# Patient Record
Sex: Female | Born: 1990 | Race: White | Hispanic: No | Marital: Single | Smoking: Never smoker
Health system: Southern US, Community
[De-identification: ages and names within clinical notes are randomized; demographics above are authoritative.]

## PROBLEM LIST (undated history)

## (undated) DIAGNOSIS — F410 Panic disorder [episodic paroxysmal anxiety] without agoraphobia: Secondary | ICD-10-CM

## (undated) DIAGNOSIS — E669 Obesity, unspecified: Secondary | ICD-10-CM

## (undated) DIAGNOSIS — H532 Diplopia: Secondary | ICD-10-CM

## (undated) DIAGNOSIS — E538 Deficiency of other specified B group vitamins: Secondary | ICD-10-CM

## (undated) DIAGNOSIS — N390 Urinary tract infection, site not specified: Secondary | ICD-10-CM

## (undated) DIAGNOSIS — R109 Unspecified abdominal pain: Secondary | ICD-10-CM

## (undated) DIAGNOSIS — F909 Attention-deficit hyperactivity disorder, unspecified type: Secondary | ICD-10-CM

## (undated) DIAGNOSIS — G43909 Migraine, unspecified, not intractable, without status migrainosus: Secondary | ICD-10-CM

## (undated) DIAGNOSIS — R002 Palpitations: Secondary | ICD-10-CM

## (undated) DIAGNOSIS — E559 Vitamin D deficiency, unspecified: Secondary | ICD-10-CM

## (undated) DIAGNOSIS — M549 Dorsalgia, unspecified: Secondary | ICD-10-CM

## (undated) DIAGNOSIS — R519 Headache, unspecified: Secondary | ICD-10-CM

## (undated) DIAGNOSIS — R599 Enlarged lymph nodes, unspecified: Secondary | ICD-10-CM

## (undated) DIAGNOSIS — L900 Lichen sclerosus et atrophicus: Secondary | ICD-10-CM

## (undated) DIAGNOSIS — R6889 Other general symptoms and signs: Secondary | ICD-10-CM

## (undated) DIAGNOSIS — R079 Chest pain, unspecified: Secondary | ICD-10-CM

## (undated) DIAGNOSIS — E271 Primary adrenocortical insufficiency: Secondary | ICD-10-CM

## (undated) DIAGNOSIS — K635 Polyp of colon: Secondary | ICD-10-CM

## (undated) DIAGNOSIS — N904 Leukoplakia of vulva: Secondary | ICD-10-CM

## (undated) DIAGNOSIS — R51 Headache: Secondary | ICD-10-CM

## (undated) DIAGNOSIS — E785 Hyperlipidemia, unspecified: Secondary | ICD-10-CM

## (undated) DIAGNOSIS — L509 Urticaria, unspecified: Secondary | ICD-10-CM

## (undated) DIAGNOSIS — N83201 Unspecified ovarian cyst, right side: Secondary | ICD-10-CM

## (undated) DIAGNOSIS — F419 Anxiety disorder, unspecified: Secondary | ICD-10-CM

## (undated) DIAGNOSIS — F32A Depression, unspecified: Secondary | ICD-10-CM

## (undated) DIAGNOSIS — F329 Major depressive disorder, single episode, unspecified: Secondary | ICD-10-CM

## (undated) DIAGNOSIS — K909 Intestinal malabsorption, unspecified: Secondary | ICD-10-CM

## (undated) DIAGNOSIS — Z91018 Allergy to other foods: Secondary | ICD-10-CM

## (undated) DIAGNOSIS — R12 Heartburn: Secondary | ICD-10-CM

## (undated) DIAGNOSIS — K6389 Other specified diseases of intestine: Secondary | ICD-10-CM

## (undated) DIAGNOSIS — N200 Calculus of kidney: Secondary | ICD-10-CM

## (undated) DIAGNOSIS — R5383 Other fatigue: Secondary | ICD-10-CM

## (undated) DIAGNOSIS — D369 Benign neoplasm, unspecified site: Secondary | ICD-10-CM

## (undated) DIAGNOSIS — L309 Dermatitis, unspecified: Secondary | ICD-10-CM

## (undated) DIAGNOSIS — K638219 Small intestinal bacterial overgrowth, unspecified: Secondary | ICD-10-CM

## (undated) DIAGNOSIS — E282 Polycystic ovarian syndrome: Secondary | ICD-10-CM

## (undated) HISTORY — DX: Urinary tract infection, site not specified: N39.0

## (undated) HISTORY — DX: Intestinal malabsorption, unspecified: K90.9

## (undated) HISTORY — PX: KIDNEY STONE SURGERY: SHX686

## (undated) HISTORY — DX: Enlarged lymph nodes, unspecified: R59.9

## (undated) HISTORY — DX: Anxiety disorder, unspecified: F41.9

## (undated) HISTORY — PX: COLONOSCOPY: SHX174

## (undated) HISTORY — DX: Unspecified ovarian cyst, right side: N83.201

## (undated) HISTORY — DX: Palpitations: R00.2

## (undated) HISTORY — DX: Urticaria, unspecified: L50.9

## (undated) HISTORY — DX: Depression, unspecified: F32.A

## (undated) HISTORY — DX: Leukoplakia of vulva: N90.4

## (undated) HISTORY — DX: Allergy to other foods: Z91.018

## (undated) HISTORY — DX: Major depressive disorder, single episode, unspecified: F32.9

## (undated) HISTORY — DX: Hyperlipidemia, unspecified: E78.5

## (undated) HISTORY — DX: Headache, unspecified: R51.9

## (undated) HISTORY — DX: Deficiency of other specified B group vitamins: E53.8

## (undated) HISTORY — DX: Vitamin D deficiency, unspecified: E55.9

## (undated) HISTORY — DX: Chest pain, unspecified: R07.9

## (undated) HISTORY — DX: Unspecified abdominal pain: R10.9

## (undated) HISTORY — DX: Obesity, unspecified: E66.9

## (undated) HISTORY — DX: Other general symptoms and signs: R68.89

## (undated) HISTORY — DX: Diplopia: H53.2

## (undated) HISTORY — DX: Headache: R51

## (undated) HISTORY — DX: Panic disorder (episodic paroxysmal anxiety): F41.0

## (undated) HISTORY — DX: Dorsalgia, unspecified: M54.9

## (undated) HISTORY — DX: Attention-deficit hyperactivity disorder, unspecified type: F90.9

## (undated) HISTORY — DX: Polyp of colon: K63.5

## (undated) HISTORY — DX: Migraine, unspecified, not intractable, without status migrainosus: G43.909

## (undated) HISTORY — DX: Lichen sclerosus et atrophicus: L90.0

## (undated) HISTORY — DX: Primary adrenocortical insufficiency: E27.1

## (undated) HISTORY — DX: Polycystic ovarian syndrome: E28.2

## (undated) HISTORY — DX: Other specified diseases of intestine: K63.89

## (undated) HISTORY — DX: Heartburn: R12

## (undated) HISTORY — DX: Calculus of kidney: N20.0

## (undated) HISTORY — DX: Benign neoplasm, unspecified site: D36.9

## (undated) HISTORY — DX: Dermatitis, unspecified: L30.9

## (undated) HISTORY — DX: Other fatigue: R53.83

## (undated) HISTORY — DX: Small intestinal bacterial overgrowth, unspecified: K63.8219

## (undated) HISTORY — PX: OTHER SURGICAL HISTORY: SHX169

---
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
## 2011-08-17 HISTORY — PX: KIDNEY STONE SURGERY: SHX686

## 2011-08-17 HISTORY — PX: CHOLECYSTECTOMY: SHX55

## 2014-05-08 LAB — HM COLONOSCOPY

## 2014-07-10 ENCOUNTER — Encounter: Payer: Self-pay | Admitting: Physician Assistant

## 2015-02-07 ENCOUNTER — Ambulatory Visit (INDEPENDENT_AMBULATORY_CARE_PROVIDER_SITE_OTHER): Payer: PRIVATE HEALTH INSURANCE | Admitting: Family

## 2015-02-07 ENCOUNTER — Encounter (INDEPENDENT_AMBULATORY_CARE_PROVIDER_SITE_OTHER): Payer: Self-pay

## 2015-02-07 VITALS — BP 140/94 | HR 89 | Temp 98.1°F | Resp 16

## 2015-02-07 DIAGNOSIS — R5383 Other fatigue: Secondary | ICD-10-CM

## 2015-02-07 DIAGNOSIS — R1030 Lower abdominal pain, unspecified: Secondary | ICD-10-CM

## 2015-02-07 MED ORDER — DICYCLOMINE HCL 10 MG PO CAPS
ORAL_CAPSULE | ORAL | Status: DC
Start: 2015-02-07 — End: 2015-07-04

## 2015-02-07 NOTE — Progress Notes (Signed)
Subjective:      Date: 02/07/2015 7:31 PM   Patient ID: Audrey Franklin is a 24 y.o. female.    Chief Complaint:  Chief Complaint   Patient presents with   . 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 She says the past 3 weeks she's had more diarrhea.  Pt also wants to get tested for B12 levels. She used to take B12 but since the change with [facility], she hasn't gotten her shots and hasn't seen a new PCP since [employer] left. Pt has an appt with Dr. Marcille Blanco 02/19/15. Pt states she has been experiencing a lot of fatigue.    Abdominal Pain  This is a new problem. Episode onset: 3 weeks ago. The onset quality is gradual. The problem occurs constantly. The problem has been gradually worsening. The pain is located in the RUQ. Associated symptoms include diarrhea, headaches and nausea. Pertinent negatives include no dysuria, fever, flatus, frequency or vomiting. Associated symptoms comments: More frequent diarrhea; Felt feverish; Fatigue.       Problem List:  There is no problem list on file for this patient.      Current Medications:  Current Outpatient Prescriptions   Medication Sig Dispense Refill   . atomoxetine (STRATTERA) 60 MG capsule Take 60 mg by mouth daily.     Marland Kitchen buPROPion XL (WELLBUTRIN XL) 150 MG 24 hr tablet Take 150 mg by mouth every morning.     . clobetasol (TEMOVATE) 0.05 % cream Apply 1 application topically 2 (two) times daily.     Marland Kitchen EPINEPHrine (EPIPEN) 0.3 MG/0.3ML Solution Auto-injector Inject 0.3 mg into the muscle once.     . fluticasone (FLONASE) 50 MCG/ACT nasal spray 1 spray by Nasal route daily.     . norethindrone-ethinyl estradiol (JUNEL FE 1/20) 1-20 MG-MCG per tablet Take 1 tablet by mouth daily.     . Nutritional Supplements (ADRENAL COMPLEX PO) Take by mouth.     . Vilazodone HCl 20 MG Tab Take 1 tablet by mouth daily.     . vitamin B-12 (CYANOCOBALAMIN) 50 MCG tablet Take 500 mcg by  mouth daily.       No current facility-administered medications for this visit.       Allergies:  Allergies   Allergen Reactions   . Ceclor [Cefaclor] Hives       Past Medical History:  History reviewed. No pertinent past medical history.    Past Surgical History:  History reviewed. No pertinent past surgical history.    Family History:  History reviewed. No pertinent family history.    Social History:  History     Social History   . Marital Status: Single     Spouse Name: N/A   . Number of Children: N/A   . Years of Education: N/A     Occupational History   . Not on file.     Social History Main Topics   . Smoking status: Not on file   . Smokeless tobacco: Not on file   . Alcohol Use: Not on file   . Drug Use: Not on file   . Sexual Activity: Not on file     Other Topics Concern   . Not on file     Social History Narrative   . No narrative on file       The following portions of the patient's history  were reviewed and updated as appropriate: allergies, current medications, past family history, past medical history, past social history, past surgical history and problem list.    Vitals:  Filed Vitals:    02/07/15 1940   BP: 140/94   Pulse: 89   Temp: 98.1 F (36.7 C)   Resp: 16   SpO2: 98%     ROS:  Review of Systems   Constitutional: Negative for fever and chills.   Respiratory: Negative for cough, shortness of breath and wheezing.    Cardiovascular: Negative for chest pain, palpitations and orthopnea.   Gastrointestinal: Positive for nausea, abdominal pain and diarrhea. Negative for vomiting and flatus.   Genitourinary: Negative for dysuria, urgency and frequency.   Skin: Negative for itching and rash.   Neurological: Positive for headaches.     Objective:     Physical Exam:  Physical Exam   Constitutional: She is oriented to person, place, and time and well-developed, well-nourished, and in no distress.   HENT:   Head: Normocephalic and atraumatic.   Cardiovascular: Normal rate, regular rhythm, normal heart  sounds and intact distal pulses.    Pulmonary/Chest: Effort normal and breath sounds normal.   Abdominal: Soft. Normal appearance and bowel sounds are normal. There is tenderness in the right lower quadrant and left lower quadrant.   Neurological: She is alert and oriented to person, place, and time.   Skin: Skin is warm and dry.     Assessment/Plan:       1. Fatigue, unspecified type    2. Lower abdominal pain  - Vitamin B12  - CBC and differential  - Comprehensive metabolic panel  - Magnesium  - Amylase  - Lipase  - dicyclomine (BENTYL) 10 MG capsule; 1 tab po q 6 prn abd cramping  Dispense: 30 capsule; Refill: 0    Take Bentyl as prescribed to help with the cramping.  If cramping worsens due to SIBO, please stop taking the medication immediately.  Return tomorrow after you hydrate as we attempted 3 times today without success.    Pt agreeable to plan.    Luz Brazen, DNP, FNP-BC    Supervising Physician: Dr. Sophronia Simas

## 2015-02-19 ENCOUNTER — Encounter (INDEPENDENT_AMBULATORY_CARE_PROVIDER_SITE_OTHER): Payer: Self-pay | Admitting: Internal Medicine

## 2015-02-19 ENCOUNTER — Ambulatory Visit (INDEPENDENT_AMBULATORY_CARE_PROVIDER_SITE_OTHER): Payer: PRIVATE HEALTH INSURANCE | Admitting: Family Medicine

## 2015-02-19 ENCOUNTER — Ambulatory Visit (INDEPENDENT_AMBULATORY_CARE_PROVIDER_SITE_OTHER): Payer: PRIVATE HEALTH INSURANCE | Admitting: Internal Medicine

## 2015-02-19 ENCOUNTER — Encounter (INDEPENDENT_AMBULATORY_CARE_PROVIDER_SITE_OTHER): Payer: Self-pay | Admitting: Family Medicine

## 2015-02-19 ENCOUNTER — Other Ambulatory Visit (INDEPENDENT_AMBULATORY_CARE_PROVIDER_SITE_OTHER): Payer: Self-pay | Admitting: Internal Medicine

## 2015-02-19 VITALS — BP 160/100 | HR 99 | Temp 98.0°F | Resp 16 | Ht 63.0 in | Wt 246.0 lb

## 2015-02-19 VITALS — BP 123/85 | HR 85 | Temp 98.4°F | Resp 16 | Ht 63.0 in | Wt 244.8 lb

## 2015-02-19 DIAGNOSIS — E282 Polycystic ovarian syndrome: Secondary | ICD-10-CM | POA: Insufficient documentation

## 2015-02-19 DIAGNOSIS — R5383 Other fatigue: Secondary | ICD-10-CM

## 2015-02-19 DIAGNOSIS — I10 Essential (primary) hypertension: Secondary | ICD-10-CM

## 2015-02-19 DIAGNOSIS — Z6841 Body Mass Index (BMI) 40.0 and over, adult: Secondary | ICD-10-CM

## 2015-02-19 NOTE — Progress Notes (Signed)
Subjective:      Date: 02/19/2015 10:32 AM   Patient ID: Audrey Franklin is a 24 y.o. female.    Chief Complaint:  Chief Complaint   Patient presents with   . Follow-up     PCOS       HPI     Patient is here for PCOS . LMP: 02/17/2014 . Patient complains of this last month June, having every other week menstrual periods. Patient complains of continued fatigue , diarrhea has gradually gotten worse. Patient is going to go to [facility] in September for SIBO.    Has had multiple episodes of diarrhea. Seen by Dr. Marcille Blanco this morning. Has an appointment with [facility] for chronic SIBO. Has been treated with multiple rounds of antibiotics and has not noticed any symptoms relief.     Taking junel and reports regular menses on the birth control. Had an unexpected menses after missing a few doses of birth control. Reports interval worsening of acne. No interval worsening of hirsutism and alopecia.    Seen by GI- Dr. Allena Katz for chronic diarrhea. Weight gain of over 30 lbs within the last 1 year. PGM has h/o thyroid dysfunction. Ms. Chawla is a known lesbian and reports good family support. H/o heavy ad irregular menses, improved on birth control.     Problem List:  Patient Active Problem List   Diagnosis   . PCOS (polycystic ovarian syndrome)   . Morbid obesity with BMI of 40.0-44.9, adult       Current Medications:  Current Outpatient Prescriptions   Medication Sig Dispense Refill   . atomoxetine (STRATTERA) 60 MG capsule Take 60 mg by mouth daily.     Marland Kitchen buPROPion XL (WELLBUTRIN XL) 150 MG 24 hr tablet Take 150 mg by mouth every morning.     . clobetasol (TEMOVATE) 0.05 % cream Apply 1 application topically 2 (two) times daily.     Marland Kitchen dicyclomine (BENTYL) 10 MG capsule 1 tab po q 6 prn abd cramping 30 capsule 0   . EPINEPHrine (EPIPEN) 0.3 MG/0.3ML Solution Auto-injector Inject 0.3 mg into the muscle once.     . fluticasone (FLONASE) 50 MCG/ACT nasal spray 1 spray by Nasal route daily.     . norethindrone-ethinyl estradiol (JUNEL FE 1/20)  1-20 MG-MCG per tablet Take 1 tablet by mouth daily.     . Nutritional Supplements (ADRENAL COMPLEX PO) Take by mouth.     . Vilazodone HCl 20 MG Tab Take 1 tablet by mouth daily.     . cyanocobalamin (VITAMIN B12) 1000 MCG/ML injection USE 1 ML ONCE WEEKLY  3   . vitamin B-12 (CYANOCOBALAMIN) 50 MCG tablet Take 500 mcg by mouth daily.       No current facility-administered medications for this visit.       Allergies:  Allergies   Allergen Reactions   . Bee Pollen    . Ceclor [Cefaclor] Hives       Past Medical History:  Past Medical History   Diagnosis Date   . Heartburn    . Anxiety    . ADHD (attention deficit hyperactivity disorder)    . Eczema    . Tubular adenoma    . Small intestinal bacterial overgrowth    . Fatigue    . PCOS (polycystic ovarian syndrome)    . Kidney stones        Past Surgical History:  Past Surgical History   Procedure Laterality Date   . Kidney stone surgery     .  Wisdome teeth         Family History:  Family History   Problem Relation Age of Onset   . Diabetes Mother    . Hypertension Mother    . Cancer Paternal Aunt    . Stroke Paternal Grandfather        Social History:  History     Social History   . Marital Status: Single     Spouse Name: N/A   . Number of Children: N/A   . Years of Education: N/A     Occupational History   . Not on file.     Social History Main Topics   . Smoking status: Never Smoker    . Smokeless tobacco: Never Used   . Alcohol Use: No   . Drug Use: No   . Sexual Activity: No     Other Topics Concern   . Not on file     Social History Narrative       The following portions of the patient's history were reviewed and updated as appropriate: allergies, current medications, past family history, past medical history, past social history, past surgical history and problem list.    ROS: A complete 12 point ROS was obtained. Pertinent positives and negatives as noted in HPI. All other systems are negative.    Vitals:  BP 123/85 mmHg  Pulse 85  Temp(Src) 98.4 F (36.9  C)  Resp 16  Ht 1.6 m (5\' 3" )  Wt 111.041 kg (244 lb 12.8 oz)  BMI 43.38 kg/m2  SpO2 98%  LMP 02/18/2015       Physical Exam   Constitutional: She is oriented to person, place, and time. She appears well-developed and well-nourished.   HENT:   Head: Normocephalic and atraumatic.   Mouth/Throat: Oropharynx is clear and moist.   Eyes: Conjunctivae and EOM are normal. Pupils are equal, round, and reactive to light.   Neck: Normal range of motion. Neck supple. No thyromegaly present.   Cardiovascular: Normal rate, regular rhythm, normal heart sounds and intact distal pulses.    Pulmonary/Chest: Effort normal and breath sounds normal.   Abdominal: Soft. Bowel sounds are normal. She exhibits no distension. There is no tenderness.   Musculoskeletal: Normal range of motion. She exhibits no edema or tenderness.   No tremors on outstretched arms.   Lymphadenopathy:     She has no cervical adenopathy.   Neurological: She is alert and oriented to person, place, and time. She has normal reflexes. No cranial nerve deficit.   Skin: Skin is warm and dry.   Psychiatric: She has a normal mood and affect.     ASSESSMENT AND PLAN    1. PCOS (polycystic ovarian syndrome)  Menses regular on OCPs, interval improvement in acne, hirsutism and alopecia. Not started on metformin due to ongoing episodes of diarrhea.  - Comprehensive metabolic panel  - TSH  - T4, free  - Hemoglobin A1C  - DHEA-sulfate  - Testosterone    2. Morbid obesity with BMI of 40.0-44.9, adult  Recommend calorie restricted diet and exercise for weight loss      3. Body mass index 40.0-44.9, adult    4. Small Intestinal Bacterial Overgrowth  Followed by gastroenterologist; plans to follow up at [facility]    RTC in 3 months    Tanya Nones MD MPH  Endocrinologist, [facility]

## 2015-02-19 NOTE — Patient Instructions (Signed)
Polycystic Ovary Syndrome  Polycystic ovary syndrome (PCOS) causes harmless, smallcysts in the ovaries and other symptoms. PCOS is caused by certain hormones being out of balance. The word "syndrome" means a group of symptoms. Women with PCOS may have no periods, irregular periods, or very long periods.    Your ovaries  Women store their eggs in their ovaries. Each egg is in a capsule called a follicle. Normally during the reproductive years, one follicle grows to produce a mature egg each month. This egg is released during ovulation and the follicle dissolves.  Hormones out of balance  With polycystic ovary syndrome (PCOS), the hormones that control ovulation are out of balance. These include estrogen, progesterones, and androgen.As a result, ovulation may not occur. Instead, thefollicle stays enlarged. This is a fluid-filled sac (cyst). Over time, the ovaries fill with many small cysts. This is why they are called "poly" (many) "cystic" ovaries. In some women, the ovaries also make toomuch androgen.      2000-2015 [employer] [address], [place] [address]. All rights reserved. This information is not intended as a substitute for professional medical care. Always follow your healthcare professional's instructions.

## 2015-02-19 NOTE — Progress Notes (Addendum)
Subjective:      Date: 02/19/2015 9:30 AM   Patient ID: Audrey Franklin is a 24 y.o. female.    Chief Complaint:  Chief Complaint   Patient presents with   . Fatigue       HPI:  Fatigue  This is a chronic problem. Episode onset: on going 1.5 years. The problem occurs constantly. The problem has been gradually worsening. Associated symptoms include abdominal pain, fatigue, headaches and nausea. Treatments tried: b 12.   Patient recently had B12 levels and Lyme's testing which were both normal. She is under the care of Dr. Allena Katz for chronic abdominal pain-she will be undergoing a clinical trial at [facility] in 3 months to help treat SIBO infection non-pharmacologically.    Patient's BP has been elevated during the past 2 visits. She is not checking at home. Trying to maintain normal exercise routine despite issues with joint pain and abdmonial pain. Has never been evaluated for sleep apnea.    Problem List:  There is no problem list on file for this patient.      Current Medications:  Current Outpatient Prescriptions   Medication Sig Dispense Refill   . atomoxetine (STRATTERA) 60 MG capsule Take 60 mg by mouth daily.     Marland Kitchen buPROPion XL (WELLBUTRIN XL) 150 MG 24 hr tablet Take 150 mg by mouth every morning.     . clobetasol (TEMOVATE) 0.05 % cream Apply 1 application topically 2 (two) times daily.     Marland Kitchen dicyclomine (BENTYL) 10 MG capsule 1 tab po q 6 prn abd cramping 30 capsule 0   . EPINEPHrine (EPIPEN) 0.3 MG/0.3ML Solution Auto-injector Inject 0.3 mg into the muscle once.     . fluticasone (FLONASE) 50 MCG/ACT nasal spray 1 spray by Nasal route daily.     . norethindrone-ethinyl estradiol (JUNEL FE 1/20) 1-20 MG-MCG per tablet Take 1 tablet by mouth daily.     . Nutritional Supplements (ADRENAL COMPLEX PO) Take by mouth.     . Vilazodone HCl 20 MG Tab Take 1 tablet by mouth daily.     . vitamin B-12 (CYANOCOBALAMIN) 50 MCG tablet Take 500 mcg by mouth daily.     . cyanocobalamin (VITAMIN B12) 1000 MCG/ML injection  USE 1 ML ONCE WEEKLY  3     No current facility-administered medications for this visit.       Allergies:  Allergies   Allergen Reactions   . Bee Pollen    . Ceclor [Cefaclor] Hives       Past Medical History:  Past Medical History   Diagnosis Date   . Heartburn    . Anxiety    . ADHD (attention deficit hyperactivity disorder)    . Eczema    . Tubular adenoma    . Small intestinal bacterial overgrowth    . Fatigue    . PCOS (polycystic ovarian syndrome)    . Kidney stones        Past Surgical History:  Past Surgical History   Procedure Laterality Date   . Kidney stone surgery     . Wisdome teeth         Family History:  Family History   Problem Relation Age of Onset   . Diabetes Mother    . Hypertension Mother    . Cancer Paternal Aunt    . Stroke Paternal Grandfather        Social History:  History     Social History   . Marital Status: Single  Spouse Name: N/A   . Number of Children: N/A   . Years of Education: N/A     Occupational History   . Not on file.     Social History Main Topics   . Smoking status: Never Smoker    . Smokeless tobacco: Never Used   . Alcohol Use: No   . Drug Use: No   . Sexual Activity: No     Other Topics Concern   . Not on file     Social History Narrative       The following portions of the patient's history were reviewed and updated as appropriate: allergies, current medications, past family history, past medical history, past social history, past surgical history and problem list.      ROS:  General/Constitutional:   Denies Chills. Admits Fatigue. Denies Fever.   Ophthalmologic:   Denies Blurred vision.   ENT:   Denies Nasal Discharge. Denies Sinus pain. Denies Sore throat.   Respiratory:   Denies Cough. Denies Shortness of breath. Denies Wheezing.   Cardiovascular:   Denies Chest pain. Denies Chest pain with exertion. Denies Palpitations.   Gastrointestinal:   Admits Abdominal pain. Denies Constipation. Admits Diarrhea. Denies Nausea.   Skin:   Admits Rash.   Neurologic:   Denies  Dizziness. Denies Headache. Denies Tingling/Numbness.       Objective:     Vitals:  BP 160/100 mmHg  Pulse 99  Temp(Src) 98 F (36.7 C)  Resp 16  Ht 1.6 m (5\' 3" )  Wt 111.585 kg (246 lb)  BMI 43.59 kg/m2  SpO2 99%  LMP 02/18/2015    Physical Exam:  General Examination:   GENERAL APPEARANCE: alert, in no acute distress, well developed, well nourished, oriented to time, place, and person.   ORAL CAVITY:  mucosa moist.   LUNGS: normal effort / no distress  EXTREMITIES: no clubbing, cyanosis, or edema bilaterally.   PERIPHERAL PULSES: 2+ dorsalis pedis, 2+ posterior tibial bilaterally.   NEUROLOGIC: alert and oriented.       Assessment/Plan:       1. Fatigue, unspecified type  Chronic, stable. Discussed possible follow up with Naturopathic provider to review dietary changes/supplementation. Will discuss further at upcoming visit.    2. Essential hypertension  Isolated, stable. Recommend patient start checking BP twice daily, for at least 3/7 days weekly and return with log in 2 weeks. If consistently >140/90, will provide low dose medication. May consider referral to sleep medicine as well.      Carney Bern, MD

## 2015-02-20 LAB — COMPREHENSIVE METABOLIC PANEL
ALT: 24 IU/L (ref 0–32)
AST (SGOT): 22 IU/L (ref 0–40)
Albumin/Globulin Ratio: 1.4 (ref 1.1–2.5)
Albumin: 4.3 g/dL (ref 3.5–5.5)
Alkaline Phosphatase: 89 IU/L (ref 39–117)
BUN / Creatinine Ratio: 14 (ref 8–20)
BUN: 10 mg/dL (ref 6–20)
Bilirubin, Total: 0.4 mg/dL (ref 0.0–1.2)
CO2: 23 mmol/L (ref 18–29)
Calcium: 9.3 mg/dL (ref 8.7–10.2)
Chloride: 99 mmol/L (ref 97–108)
Creatinine: 0.74 mg/dL (ref 0.57–1.00)
EGFR: 115 mL/min/{1.73_m2} (ref >59)
EGFR: 132 mL/min/{1.73_m2} (ref >59)
Globulin, Total: 3.1 g/dL (ref 1.5–4.5)
Glucose: 80 mg/dL (ref 65–99)
Potassium: 4.1 mmol/L (ref 3.5–5.2)
Protein, Total: 7.4 g/dL (ref 6.0–8.5)
Sodium: 139 mmol/L (ref 134–144)

## 2015-02-20 LAB — HEMOGLOBIN A1C: Hemoglobin A1C: 5 % (ref 4.8–5.6)

## 2015-02-20 LAB — T4, FREE: T4, Free: 1.12 ng/dL (ref 0.82–1.77)

## 2015-02-20 LAB — TESTOSTERONE: Testosterone: 16 ng/dL (ref 8–48)

## 2015-02-20 LAB — TSH: TSH: 2.18 u[IU]/mL (ref 0.450–4.500)

## 2015-02-20 LAB — DHEA-SULFATE: DHEA-SO4: 158.5 ug/dL (ref 110.0–431.7)

## 2015-03-01 ENCOUNTER — Ambulatory Visit (INDEPENDENT_AMBULATORY_CARE_PROVIDER_SITE_OTHER): Payer: PRIVATE HEALTH INSURANCE | Admitting: Family Medicine

## 2015-03-01 ENCOUNTER — Encounter (INDEPENDENT_AMBULATORY_CARE_PROVIDER_SITE_OTHER): Payer: Self-pay | Admitting: Family Medicine

## 2015-03-01 VITALS — BP 135/85 | HR 110 | Temp 98.0°F | Resp 18 | Ht 63.0 in | Wt 242.0 lb

## 2015-03-01 DIAGNOSIS — I10 Essential (primary) hypertension: Secondary | ICD-10-CM

## 2015-03-01 DIAGNOSIS — F411 Generalized anxiety disorder: Secondary | ICD-10-CM

## 2015-03-01 NOTE — Progress Notes (Signed)
Subjective:      Date: 03/01/2015 3:02 PM   Patient ID: Audrey Franklin is a 24 y.o. female.    Chief Complaint:  Chief Complaint   Patient presents with   . Anxiety   . Hypertension       HPI:  Anxiety  Presents for follow-up visit. Onset was 1 to 5 years ago. The problem has been gradually worsening. Patient reports no chest pain, compulsions, confusion, decreased concentration, depressed mood, dizziness, dry mouth, excessive worry, feeling of choking, hyperventilation, impotence, insomnia, irritability, malaise, muscle tension, nausea, nervous/anxious behavior, obsessions, palpitations, panic, restlessness, shortness of breath or suicidal ideas. Symptoms occur most days. The severity of symptoms is moderate. The quality of sleep is good.       Hypertension  Associated symptoms include anxiety. Pertinent negatives include no chest pain, palpitations or shortness of breath.       Problem List:  Patient Active Problem List   Diagnosis   . PCOS (polycystic ovarian syndrome)   . Morbid obesity with BMI of 40.0-44.9, adult       Current Medications:  Current Outpatient Prescriptions   Medication Sig Dispense Refill   . atomoxetine (STRATTERA) 60 MG capsule Take 60 mg by mouth daily.     Marland Kitchen buPROPion XL (WELLBUTRIN XL) 150 MG 24 hr tablet Take 150 mg by mouth every morning.     . clobetasol (TEMOVATE) 0.05 % cream Apply 1 application topically 2 (two) times daily.     . cyanocobalamin (VITAMIN B12) 1000 MCG/ML injection USE 1 ML ONCE WEEKLY  3   . dicyclomine (BENTYL) 10 MG capsule 1 tab po q 6 prn abd cramping 30 capsule 0   . EPINEPHrine (EPIPEN) 0.3 MG/0.3ML Solution Auto-injector Inject 0.3 mg into the muscle once.     . fluticasone (FLONASE) 50 MCG/ACT nasal spray 1 spray by Nasal route daily.     . norethindrone-ethinyl estradiol (JUNEL FE 1/20) 1-20 MG-MCG per tablet Take 1 tablet by mouth daily.     . Nutritional Supplements (ADRENAL COMPLEX PO) Take by mouth.     . Vilazodone HCl 20 MG Tab Take 1 tablet by mouth  daily.     . vitamin B-12 (CYANOCOBALAMIN) 50 MCG tablet Take 500 mcg by mouth daily.       No current facility-administered medications for this visit.       Allergies:  Allergies   Allergen Reactions   . Bee Pollen    . Ceclor [Cefaclor] Hives       Past Medical History:  Past Medical History   Diagnosis Date   . Heartburn    . Anxiety    . ADHD (attention deficit hyperactivity disorder)    . Eczema    . Tubular adenoma    . Small intestinal bacterial overgrowth    . Fatigue    . PCOS (polycystic ovarian syndrome)    . Kidney stones        Past Surgical History:  Past Surgical History   Procedure Laterality Date   . Kidney stone surgery     . Wisdome teeth         Family History:  Family History   Problem Relation Age of Onset   . Diabetes Mother    . Hypertension Mother    . Cancer Paternal Aunt    . Stroke Paternal Grandfather        Social History:  History     Social History   . Marital Status: Single  Spouse Name: N/A   . Number of Children: N/A   . Years of Education: N/A     Occupational History   . Not on file.     Social History Main Topics   . Smoking status: Never Smoker    . Smokeless tobacco: Never Used   . Alcohol Use: No   . Drug Use: No   . Sexual Activity: No     Other Topics Concern   . Not on file     Social History Narrative       The following portions of the patient's history were reviewed and updated as appropriate: allergies, current medications, past family history, past medical history, past social history, past surgical history and problem list.      ROS:  General/Constitutional:   Denies Chills. Admits Fatigue. Denies Fever.   Ophthalmologic:   Denies Blurred vision.   Cardiovascular:   Denies Chest pain. Denies Chest pain with exertion. Denies Palpitations.    Gastrointestinal:   Admits Abdominal pain due to SIBO. Denies Constipation. Admits Diarrhea.  Skin:   Admits Rash due to eczema.  Neurologic:   Denies Dizziness. Denies Headache. Denies Tingling/Numbness.       Objective:      Vitals:  BP 135/85 mmHg  Pulse 110  Temp(Src) 98 F (36.7 C) (Oral)  Resp 18  Ht 1.6 m (5\' 3" )  Wt 109.77 kg (242 lb)  BMI 42.88 kg/m2  SpO2 98%  LMP 02/18/2015     Physical Exam:  General Examination:   GENERAL APPEARANCE: alert, in no acute distress, well developed, well nourished, oriented to time, place, and person.   ORAL CAVITY: normal oropharynx, normal lips, mucosa moist.   THEART: S1, S2 normal, no murmurs, rubs, gallops, regular rate and rhythm.   LUNGS: normal effort / no distress, normal breath sounds, clear to auscultation bilaterally, no wheezes, rales, rhonchi.   EXTREMITIES: no clubbing, cyanosis, or edema bilaterally.   NEUROLOGIC: alert and oriented.       Assessment/Plan:       1. Essential hypertension  Improved greatly from prior reading. Continue home monitoring. Diet/exercise recommendations reviewed. Patient will consider getting a job closer to home so she has more time in her day to exercise.    2. Generalized anxiety disorder  Chronic, stable. Continue meeting with your therapist once weekly, taking medication as prescribed. Discussed following up with [employer] for life coaching to help with prioritization of life goals and means to accomplish daily tasks.      Carney Bern, MD

## 2015-03-01 NOTE — Progress Notes (Deleted)
Subjective:      Date: 03/01/2015 2:53 PM   Patient ID: Audrey Franklin is a 24 y.o. female.    Chief Complaint:  Chief Complaint   Patient presents with   . Hypertension   . Anxiety       HPI:  Hypertension  Associated symptoms include anxiety, headaches and malaise/fatigue. Pertinent negatives include no blurred vision, chest pain, neck pain, orthopnea, palpitations, peripheral edema, PND, shortness of breath or sweats. Risk factors for coronary artery disease include obesity. The current treatment provides no improvement.   Anxiety  Patient reports no chest pain, palpitations or shortness of breath.           Problem List:  Patient Active Problem List   Diagnosis   . PCOS (polycystic ovarian syndrome)   . Morbid obesity with BMI of 40.0-44.9, adult       Current Medications:  Current Outpatient Prescriptions   Medication Sig Dispense Refill   . atomoxetine (STRATTERA) 60 MG capsule Take 60 mg by mouth daily.     Marland Kitchen buPROPion XL (WELLBUTRIN XL) 150 MG 24 hr tablet Take 150 mg by mouth every morning.     . clobetasol (TEMOVATE) 0.05 % cream Apply 1 application topically 2 (two) times daily.     . cyanocobalamin (VITAMIN B12) 1000 MCG/ML injection USE 1 ML ONCE WEEKLY  3   . dicyclomine (BENTYL) 10 MG capsule 1 tab po q 6 prn abd cramping 30 capsule 0   . EPINEPHrine (EPIPEN) 0.3 MG/0.3ML Solution Auto-injector Inject 0.3 mg into the muscle once.     . fluticasone (FLONASE) 50 MCG/ACT nasal spray 1 spray by Nasal route daily.     . norethindrone-ethinyl estradiol (JUNEL FE 1/20) 1-20 MG-MCG per tablet Take 1 tablet by mouth daily.     . Nutritional Supplements (ADRENAL COMPLEX PO) Take by mouth.     . Vilazodone HCl 20 MG Tab Take 1 tablet by mouth daily.     . vitamin B-12 (CYANOCOBALAMIN) 50 MCG tablet Take 500 mcg by mouth daily.       No current facility-administered medications for this visit.       Allergies:  Allergies   Allergen Reactions   . Bee Pollen    . Ceclor [Cefaclor] Hives       Past Medical  History:  Past Medical History   Diagnosis Date   . Heartburn    . Anxiety    . ADHD (attention deficit hyperactivity disorder)    . Eczema    . Tubular adenoma    . Small intestinal bacterial overgrowth    . Fatigue    . PCOS (polycystic ovarian syndrome)    . Kidney stones        Past Surgical History:  Past Surgical History   Procedure Laterality Date   . Kidney stone surgery     . Wisdome teeth         Family History:  Family History   Problem Relation Age of Onset   . Diabetes Mother    . Hypertension Mother    . Cancer Paternal Aunt    . Stroke Paternal Grandfather        Social History:  History     Social History   . Marital Status: Single     Spouse Name: N/A   . Number of Children: N/A   . Years of Education: N/A     Occupational History   . Not on file.  Social History Main Topics   . Smoking status: Never Smoker    . Smokeless tobacco: Never Used   . Alcohol Use: No   . Drug Use: No   . Sexual Activity: No     Other Topics Concern   . Not on file     Social History Narrative       {Common ambulatory SmartLinks:19316}    Vitals:  BP 135/85 mmHg  Pulse 110  Temp(Src) 98 F (36.7 C) (Oral)  Resp 18  Ht 1.6 m (5\' 3" )  Wt 109.77 kg (242 lb)  BMI 42.88 kg/m2  SpO2 98%  LMP 02/18/2015

## 2015-03-09 NOTE — Progress Notes (Signed)
Quick Note:    Lab results are in the normal range.  ______

## 2015-04-24 ENCOUNTER — Encounter (INDEPENDENT_AMBULATORY_CARE_PROVIDER_SITE_OTHER): Payer: Self-pay | Admitting: Internal Medicine

## 2015-05-28 ENCOUNTER — Ambulatory Visit (INDEPENDENT_AMBULATORY_CARE_PROVIDER_SITE_OTHER): Payer: PRIVATE HEALTH INSURANCE | Admitting: Internal Medicine

## 2015-06-02 ENCOUNTER — Ambulatory Visit (INDEPENDENT_AMBULATORY_CARE_PROVIDER_SITE_OTHER): Payer: PRIVATE HEALTH INSURANCE | Admitting: Internal Medicine

## 2015-06-02 ENCOUNTER — Encounter (INDEPENDENT_AMBULATORY_CARE_PROVIDER_SITE_OTHER): Payer: Self-pay | Admitting: Internal Medicine

## 2015-06-02 VITALS — BP 122/78 | HR 103 | Temp 97.2°F | Resp 16 | Ht 63.0 in | Wt 250.4 lb

## 2015-06-02 DIAGNOSIS — Z6841 Body Mass Index (BMI) 40.0 and over, adult: Secondary | ICD-10-CM

## 2015-06-02 DIAGNOSIS — E282 Polycystic ovarian syndrome: Secondary | ICD-10-CM

## 2015-06-02 MED ORDER — METFORMIN HCL ER (MOD) 500 MG PO TB24
500.0000 mg | ORAL_TABLET | Freq: Every day | ORAL | Status: AC
Start: 2015-06-02 — End: 2015-08-31

## 2015-06-02 NOTE — Progress Notes (Signed)
Subjective:      Date: 06/02/2015 8:18 AM   Patient ID: Audrey Franklin is a 24 y.o. female.    Chief Complaint:  Chief Complaint   Patient presents with   . PCOS (polycystic ovarian syndrome)       HPI     Patient is here for PCOS . LMP: 02/17/2014 . Patient complains of this last month June, having every other week menstrual periods. Patient complains of continued fatigue , diarrhea has gradually gotten worse. Patient had an appointment at [facility] in September for SIBO. Plans to seek a second opinion.    Has had continued multiple episodes of diarrhea. Seen by Dr. Marcille Blanco this morning. Has an appointment with [facility] for chronic SIBO. Has been treated with multiple rounds of antibiotics and has not noticed any symptoms relief.     Taking junel and reports regular menses on the birth control. Had an unexpected menses after missing a few doses of birth control. Reports interval worsening of acne. No interval worsening of hirsutism and alopecia.    Seen by GI- Dr. Allena Katz for chronic diarrhea. Weight gain of over 30 lbs within the last 1 year. PGM has h/o thyroid dysfunction. [name] is a known lesbian and reports good family support.  H/O heavy and irregular menses, improved on birth control.     Problem List:  Patient Active Problem List   Diagnosis   . PCOS (polycystic ovarian syndrome)   . Morbid obesity with BMI of 40.0-44.9, adult       Current Medications:  Current Outpatient Prescriptions   Medication Sig Dispense Refill   . atomoxetine (STRATTERA) 60 MG capsule Take 60 mg by mouth daily.     Marland Kitchen buPROPion XL (WELLBUTRIN XL) 150 MG 24 hr tablet Take 300 mg by mouth every morning.        . clobetasol (TEMOVATE) 0.05 % cream Apply 1 application topically 2 (two) times daily.     Marland Kitchen EPINEPHrine (EPIPEN) 0.3 MG/0.3ML Solution Auto-injector Inject 0.3 mg into the muscle once.     . fluticasone (FLONASE) 50 MCG/ACT nasal spray 1 spray by Nasal route daily.     . norethindrone-ethinyl estradiol (JUNEL FE 1/20) 1-20 MG-MCG  per tablet Take 1 tablet by mouth daily.     . cyanocobalamin (VITAMIN B12) 1000 MCG/ML injection USE 1 ML ONCE WEEKLY  3   . dicyclomine (BENTYL) 10 MG capsule 1 tab po q 6 prn abd cramping 30 capsule 0   . Nutritional Supplements (ADRENAL COMPLEX PO) Take by mouth.     . Vilazodone HCl 20 MG Tab Take 1 tablet by mouth daily.     . vitamin B-12 (CYANOCOBALAMIN) 50 MCG tablet Take 500 mcg by mouth daily.       No current facility-administered medications for this visit.       Allergies:  Allergies   Allergen Reactions   . Bee Pollen    . Ceclor [Cefaclor] Hives       Past Medical History:  Past Medical History   Diagnosis Date   . Heartburn    . Anxiety    . ADHD (attention deficit hyperactivity disorder)    . Eczema    . Tubular adenoma    . Small intestinal bacterial overgrowth    . Fatigue    . PCOS (polycystic ovarian syndrome)    . Kidney stones        Past Surgical History:  Past Surgical History   Procedure Laterality Date   .  Kidney stone surgery     . Wisdome teeth         Family History:  Family History   Problem Relation Age of Onset   . Diabetes Mother    . Hypertension Mother    . Cancer Paternal Aunt    . Stroke Paternal Grandfather        Social History:  Social History     Social History   . Marital Status: Single     Spouse Name: N/A   . Number of Children: N/A   . Years of Education: N/A     Occupational History   . Not on file.     Social History Main Topics   . Smoking status: Never Smoker    . Smokeless tobacco: Never Used   . Alcohol Use: No   . Drug Use: No   . Sexual Activity: No     Other Topics Concern   . Not on file     Social History Narrative       The following portions of the patient's history were reviewed and updated as appropriate: allergies, current medications, past family history, past medical history, past social history, past surgical history and problem list.    ROS: A complete 12 point ROS was obtained. Pertinent positives and negatives as noted in HPI. All other systems are  negative.    Vitals:  BP 122/78 mmHg  Pulse 103  Temp(Src) 97.2 F (36.2 C) (Tympanic)  Resp 16  Ht 1.6 m (5\' 3" )  Wt 113.581 kg (250 lb 6.4 oz)  BMI 44.37 kg/m2  SpO2 98%  LMP 05/13/2015       Physical Exam   Constitutional: She is oriented to person, place, and time. She appears well-developed and well-nourished.   HENT:   Head: Normocephalic and atraumatic.   Mouth/Throat: Oropharynx is clear and moist.   Eyes: Conjunctivae and EOM are normal. Pupils are equal, round, and reactive to light.   Neck: Normal range of motion. Neck supple. No thyromegaly present.   Cardiovascular: Normal rate, regular rhythm, normal heart sounds and intact distal pulses.    Pulmonary/Chest: Effort normal and breath sounds normal.   Abdominal: Soft. Bowel sounds are normal. She exhibits no distension. There is no tenderness.   Musculoskeletal: Normal range of motion. She exhibits no edema or tenderness.   No tremors on outstretched arms.   Lymphadenopathy:     She has no cervical adenopathy.   Neurological: She is alert and oriented to person, place, and time. She has normal reflexes. No cranial nerve deficit.   Skin: Skin is warm and dry.   Psychiatric: She has a normal mood and affect.     ASSESSMENT AND PLAN    1. PCOS (polycystic ovarian syndrome)  Menses regular on OCPs, interval improvement in acne, hirsutism and alopecia. Discussed that we could do a trial of low dose metformin and assess clinical response; given that her symptoms of diarrhea are stable. Risk & Benefits of the new medication(s) were explained to the patient (and family) who verbalized understanding & agreed to the treatment plan. Patient (family) encouraged to contact me/clinical staff with any questions/concerns    - Comprehensive metabolic panel  - TSH  - T4, free  - Hemoglobin A1C  - DHEA-sulfate  - Testosterone    2. Morbid obesity with BMI of 40.0-44.9, adult  Recommend calorie restricted diet and exercise for weight loss      3. Body mass index  40.0-44.9, adult  4. Small Intestinal Bacterial Overgrowth  Followed by gastroenterologist; plans to follow up at [facility]    RTC in 3 months    Tanya Nones MD MPH  Endocrinologist, [facility]

## 2015-06-02 NOTE — Patient Instructions (Signed)
Polycystic Ovary Syndrome  Polycystic ovary syndrome (PCOS) causes harmless, smallcysts in the ovaries and other symptoms. PCOS is caused by certain hormones being out of balance. The word "syndrome" means a group of symptoms. Women with PCOS may have no periods, irregular periods, or very long periods.    Your ovaries  Women store their eggs in their ovaries. Each egg is in a capsule called a follicle. Normally during the reproductive years, one follicle grows to produce a mature egg each month. This egg is released during ovulation and the follicle dissolves.  Hormones out of balance  With polycystic ovary syndrome (PCOS), the hormones that control ovulation are out of balance. These include estrogen, progesterones, and androgen.As a result, ovulation may not occur. Instead, thefollicle stays enlarged. This is a fluid-filled sac (cyst). Over time, the ovaries fill with many small cysts. This is why they are called "poly" (many) "cystic" ovaries. In some women, the ovaries also make toomuch androgen.      2000-2015 [employer] [address], [place] [address]. All rights reserved. This information is not intended as a substitute for professional medical care. Always follow your healthcare professional's instructions.

## 2015-07-01 ENCOUNTER — Telehealth (INDEPENDENT_AMBULATORY_CARE_PROVIDER_SITE_OTHER): Payer: Self-pay

## 2015-07-01 NOTE — Telephone Encounter (Signed)
Spoke with pt this evening. She still has hives but not as bad. Denies trouble breathing. Pt encouraged to come in to UC.

## 2015-07-01 NOTE — Telephone Encounter (Signed)
Msg left on our voicemail that pt has been getting hives over the last few days and this morning pt noted hives inside her mouth.  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 I have tried to reach her and the pt.    Pt has a full mailbox, so I left msg on mom's cell that pt needs to be seen asap.

## 2015-07-01 NOTE — Telephone Encounter (Signed)
Noted, agree with plan.

## 2015-07-04 ENCOUNTER — Ambulatory Visit (INDEPENDENT_AMBULATORY_CARE_PROVIDER_SITE_OTHER): Payer: PRIVATE HEALTH INSURANCE | Admitting: Family Medicine

## 2015-07-04 ENCOUNTER — Encounter (INDEPENDENT_AMBULATORY_CARE_PROVIDER_SITE_OTHER): Payer: Self-pay | Admitting: Family Medicine

## 2015-07-04 VITALS — BP 132/86 | HR 104 | Temp 98.4°F | Resp 16 | Ht 63.0 in | Wt 249.0 lb

## 2015-07-04 DIAGNOSIS — F419 Anxiety disorder, unspecified: Secondary | ICD-10-CM

## 2015-07-04 DIAGNOSIS — M2550 Pain in unspecified joint: Secondary | ICD-10-CM

## 2015-07-04 DIAGNOSIS — L509 Urticaria, unspecified: Secondary | ICD-10-CM

## 2015-07-04 NOTE — Progress Notes (Signed)
Subjective:      Date: 07/04/2015 8:14 AM   Patient ID: Audrey Franklin is a 24 y.o. female.    Chief Complaint:  Chief Complaint   Patient presents with   . Urticaria       HPI:  Urticaria  This is a new problem. The affected locations include the left hand, left buttock, abdomen, groin, scalp, head, face, chest, neck, torso, back, right hand and right buttock.   Urticaria  Hives are improving-she notices them an hour after she awakens, gradually dissipate throughout the day without taking medication.   Associated symptoms includes arthralgia of wrists, elbows, knees. No joint swelling, effusion or redness.  Anxiety  Patient's anxiety has been worsening due to stress at work/concern for loss of health insurance. Has not been able to exercise regularly. Dosages of medications increased by psychiatry recently. Admits SI in September, no plan. Adequate safety mechanisms in place, supportive parents at home.    Problem List:  Patient Active Problem List   Diagnosis   . PCOS (polycystic ovarian syndrome)   . Morbid obesity with BMI of 40.0-44.9, adult       Current Medications:  Current Outpatient Prescriptions   Medication Sig Dispense Refill   . ALPRAZolam (XANAX XR) 0.5 MG 24 hr tablet Take 0.5 mg by mouth every morning.     Marland Kitchen atomoxetine (STRATTERA) 60 MG capsule Take 60 mg by mouth daily.     Marland Kitchen buPROPion XL (WELLBUTRIN XL) 150 MG 24 hr tablet Take 300 mg by mouth 2 (two) times daily.        . clobetasol (TEMOVATE) 0.05 % cream Apply 1 application topically 2 (two) times daily.     Marland Kitchen EPINEPHrine (EPIPEN) 0.3 MG/0.3ML Solution Auto-injector Inject 0.3 mg into the muscle once.     . fluticasone (FLONASE) 50 MCG/ACT nasal spray 1 spray by Nasal route daily.     Colleen Can 1/20 1-20 MG-MCG per tablet TAKE 1 TABLET ONCE A DAY  6   . metFORMIN (GLUMETZA) 500 MG (MOD) 24 hr tablet Take 1 tablet (500 mg total) by mouth daily with dinner. 90 tablet 1   . Vilazodone HCl (VIIBRYD) 20 MG Tab Take by mouth.       No current  facility-administered medications for this visit.       Allergies:  Allergies   Allergen Reactions   . Bee Pollen    . Ceclor [Cefaclor] Hives       Past Medical History:  Past Medical History   Diagnosis Date   . Heartburn    . Anxiety    . ADHD (attention deficit hyperactivity disorder)    . Eczema    . Tubular adenoma    . Small intestinal bacterial overgrowth    . Fatigue    . PCOS (polycystic ovarian syndrome)    . Kidney stones        Past Surgical History:  Past Surgical History   Procedure Laterality Date   . Kidney stone surgery     . Wisdome teeth         Family History:  Family History   Problem Relation Age of Onset   . Diabetes Mother    . Hypertension Mother    . Cancer Paternal Aunt    . Stroke Paternal Grandfather        Social History:  Social History     Social History   . Marital Status: Single     Spouse Name: N/A   .  Number of Children: N/A   . Years of Education: N/A     Occupational History   . Not on file.     Social History Main Topics   . Smoking status: Never Smoker    . Smokeless tobacco: Never Used   . Alcohol Use: No   . Drug Use: No   . Sexual Activity: No     Other Topics Concern   . Not on file     Social History Narrative       The following portions of the patient's history were reviewed and updated as appropriate: allergies, current medications, past family history, past medical history, past social history, past surgical history and problem list.      ROS:  General/Constitutional:   Denies Chills. Admits Fatigue. Denies Fever.   Ophthalmologic:   Denies Blurred vision.   Respiratory:   Denies Cough. Denies Shortness of breath. Denies Wheezing.   Cardiovascular:   Admits occasional Chest pain during panic attacks-takes xanax as needed. Denies Chest pain with exertion. Denies Palpitations.   Gastrointestinal:   Admits improving Abdominal pain. Denies Constipation. Admits improving Diarrhea. Denies Nausea. Denies Vomiting.   Skin:   Admits improving Rash/hives.   Neurologic:    Denies Dizziness. Denies Headache. Denies Tingling/Numbness.       Objective:     BP 132/86 mmHg  Pulse 104  Temp(Src) 98.4 F (36.9 C) (Oral)  Resp 16  Ht 1.6 m (5\' 3" )  Wt 112.946 kg (249 lb)  BMI 44.12 kg/m2  SpO2 98%  LMP 06/17/2015    Physical Exam:  General Examination:   GENERAL APPEARANCE: alert, in no acute distress, well developed, well nourished, oriented to time, place, and person.   ORAL CAVITY: normal oropharynx, normal lips, mucosa moist, no hives present.   THROAT: normal appearance, clear.   NECK/THYROID: neck supple, no carotid bruit, carotid pulse 2+ bilaterally, no cervical lymphadenopathy, no neck mass palpated, no jugular venous distention, no thyromegaly.   MUSCULOSKELETAL: No joint swelling, tenderness to palpation or effusions. Full range of motion.  EXTREMITIES: no clubbing, cyanosis, or edema bilaterally.   SKIN: boils present behind ears bilaterally; no rash present.  NEUROLOGIC: alert and oriented.       Assessment/Plan:       1. Urticaria  Acute, resolving. Suspect stress induced. Recommend continue with psychiatric medications and regular follow ups with therapist.    2. Arthralgia, unspecified joint  - ANA IFA w/rflx to Titer/Pattern/Cascade  - C Reactive Protein  - Sedimentation rate (ESR)  - Rheumatoid factor  Acute, stable. Will follow up rheumatologic testing; patient defers Lyme's study at this time (Recently checked  3-4 months ago.) Differential diagnosis includes psychogenic, degenerative joint disease, infectious ( viral , Lyme, tick-borne), rheumatologic ( RA, SLE), repetitive use injury, medications, endocrine ( thyroid disorders ), reactive arthritis, gout. Discussed following up for water therapy at her gym to alleviate joint pain.    3. Anxiety  Chronic, worsening. Continue to follow up with therapist/psychiatrist for regular visits. Try to engage in relaxing activities on the weekends. Discussed finding a job closer to home to alleviate stressful commute and  improve amount of time to exercise.      Carney Bern, MD

## 2015-07-05 LAB — C-REACTIVE PROTEIN: C-Reactive Protein: 26.4 mg/L — ABNORMAL HIGH (ref 0.0–4.9)

## 2015-07-05 LAB — RHEUMATOID FACTOR: RA Latex Turbid.: <10 IU/mL (ref 0.0–13.9)

## 2015-07-05 LAB — SEDIMENTATION RATE: Sed Rate: 7 mm/hr (ref 0–32)

## 2015-07-07 LAB — ANA IFA W/REFLEX TO TITER/PATTERN/AB: Antinuclear Antibodies (ANA): NEGATIVE

## 2015-07-09 ENCOUNTER — Ambulatory Visit (INDEPENDENT_AMBULATORY_CARE_PROVIDER_SITE_OTHER): Payer: Self-pay | Admitting: Acupuncturist

## 2015-07-09 DIAGNOSIS — R5383 Other fatigue: Secondary | ICD-10-CM

## 2015-07-09 DIAGNOSIS — Z6841 Body Mass Index (BMI) 40.0 and over, adult: Secondary | ICD-10-CM

## 2015-07-09 DIAGNOSIS — E282 Polycystic ovarian syndrome: Secondary | ICD-10-CM

## 2015-07-09 DIAGNOSIS — F331 Major depressive disorder, recurrent, moderate: Secondary | ICD-10-CM

## 2015-07-09 DIAGNOSIS — L509 Urticaria, unspecified: Secondary | ICD-10-CM

## 2015-07-09 DIAGNOSIS — K6389 Other specified diseases of intestine: Secondary | ICD-10-CM

## 2015-07-09 DIAGNOSIS — F419 Anxiety disorder, unspecified: Secondary | ICD-10-CM

## 2015-07-09 DIAGNOSIS — K582 Mixed irritable bowel syndrome: Secondary | ICD-10-CM | POA: Insufficient documentation

## 2015-07-09 NOTE — Progress Notes (Signed)
NATUROPATHIC INITIAL PART I - PROGRESS NOTE:        Patient: Audrey Franklin   Date/ Time: 07/12/2015 5:26 PM    MRN: [id number]   PCP: Carney Bern, MD        ASSESSMENT:   1. Small intestinal bacterial overgrowth    2. Irritable bowel syndrome with both constipation and diarrhea  - Misc Test 1; Future    3. Anxiety  - MTHFR mutation; Future    4. PCOS (polycystic ovarian syndrome)    5. Moderate episode of recurrent major depressive disorder    6. Morbid obesity with BMI of 40.0-44.9, adult  - NMR LipoProfile without IR Score; Future  - Homocysteine, serum; Future    7. Hives  - Thyroid peroxidase antibody; Future  - Anti-Thyroid Antibodies; Future    8. Other fatigue  - Thyroid peroxidase antibody; Future  - Anti-Thyroid Antibodies; Future      PLAN:     RECOMMENDATIONS    DIET  Bfast: eggs/ sausage; berries/ low FODMAP fruit; low FODMAP cooked veggies or soup  Lunch: low FODMAP soup + salad  Dinner: lean protein/ fish + low FODMAP veggies  Snack: low FODMAP nuts/ fruit/ veggies    SUPPLEMENTS  Twice Daily Multi by [employer] (overall support): take 1 cap 2x per day with meals.  Restore ([employer] or online) (heal gut lining/ restore gut ecology): 1 tsp to take before or with meals.  Vitamin C (adrenal and immune support): 500mg  2x per day.  N-Acetylcysteine by [employer] (hormonal and mood support): 900mg  taken twice daily.    MINDFULNESS:  HeadSpace meditation 10 minutes per day  Consider Transcendental Meditation    FOLLOW-UP:     Two weeks after getting labwork    CHIEF COMPLAINT:     Chief Complaint   Patient presents with   . Irritable Bowel Syndrome   . obseity   . Urticaria       HEALTH GOALS:   1. Resolve IBS  2. Loose weight  3. Get back in to balance with both physical/ mental health    HPI:     HPI    Audrey Franklin is a 24 y.o. female     Over past two years has gained 40 lbs.    Started college fall 2011 and graduated. States that she was in good health until summer 2014 health issues started  and slowly progressed, weight issues, GI issues, and was eventually "unable to function" due to anxiety and depression.    IBS  August 2015 abN EKG and blood in stool. Saw GI Dr. Allena Katz at [facility].  Colonoscopy Sept 2015, pre-cancerous polyp was removed.  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   Stomach/ upper intestinal inflammation, she thinks.  SIBO breath testing was +, took Rifaximin and two other bowel specific antibiotics after this, failed tx. Tried med to slow transit time, but no change in sx.   Was referred to [facility] by Dr. Allena Katz.   After waiting about 6 months to be evaluated at [facility] and states that she had a bad experience, was hoping for additional tx options for her GI issues and was told to see a counselor and loose weight. States anxiety and depression got much worse after this b/c she had placed so much hope into getting help with GI issues at [facility].  Tried elimination diet Sept to Jan 2014: wheat, corn, rice, tomatoes, white potatoes, soy. Elimination was based on food sensitivity testing.  Has not yet tried eliminating diary. States that FODMAPS diet was mentioned by GI but she has not been given guidance on this or tired it.   Current GI sx:  -More loose stools, sudden and intense BMs, urgency. Sometimes urgent BM triggered during middle of a meal.  -Belching, gas, bloating. Worse after eating and at night.   -Stools mostly brown to light brown and watery, sometimes orange. Sometimes undigested food.  Takes long time to fully evacuate  Worse sx with anxiety/ nervousness  Denies acid reflux.  Denies foul smell to gas.    PCOS  Spring 2015 dx with PCOS. Started on OCP at this time.  Responded well to OCP, dysmenorrhea and menorrhagia, mood swings and acne resolved. Started metformin 500mg  1x per day and tolerating well, thinks she notices loss of weight in her face.  Notes that cortisols were found to be low.    HIVES  Started two weeks ago  Appearing every morning, gone by evening  Arms, legs,  scalp, genitials, feet, hands  Hands all day this past Saturday  In mouth 1 week ago, saw PCP after this for evaluation  Needs refill for Epipen due to hx of anaphylltic allergies (venomous insects)    Hx of anxiety and depression since 2014. + SI at that time, but states stable at present, denies SI, meets with therapist 2x per week.       MEDICATIONS:     Current outpatient prescriptions:   .  ALPRAZolam (XANAX XR) 0.5 MG 24 hr tablet, Take 0.5 mg by mouth every morning., Disp: , Rfl:   .  atomoxetine (STRATTERA) 60 MG capsule, Take 60 mg by mouth daily., Disp: , Rfl:   .  buPROPion XL (WELLBUTRIN XL) 150 MG 24 hr tablet, Take 300 mg by mouth 2 (two) times daily.  , Disp: , Rfl:   .  clobetasol (TEMOVATE) 0.05 % cream, Apply 1 application topically 2 (two) times daily., Disp: , Rfl:   .  EPINEPHrine (EPIPEN) 0.3 MG/0.3ML Solution Auto-injector, Inject 0.3 mg into the muscle once., Disp: , Rfl:   .  fluticasone (FLONASE) 50 MCG/ACT nasal spray, 1 spray by Nasal route daily., Disp: , Rfl:   .  JUNEL 1/20 1-20 MG-MCG per tablet, TAKE 1 TABLET ONCE A DAY, Disp: , Rfl: 6  .  metFORMIN (GLUMETZA) 500 MG (MOD) 24 hr tablet, Take 1 tablet (500 mg total) by mouth daily with dinner., Disp: 90 tablet, Rfl: 1  .  Vilazodone HCl (VIIBRYD) 20 MG Tab, Take by mouth., Disp: , Rfl:     ALLERGIES:     Allergies   Allergen Reactions   . Bee Pollen    . Ceclor [Cefaclor] Hives       NUTRITION:     General: whole foods diet, minimal processed food; avoids bananas due to allergies; rarely eats meat/ fish due to environmental concerns and doesn't know how to cook it well   Breakfast: whole grain carb (oatmeal most times, bread) + fruit (kiwi, seasonal); Kashi like high fiber cereal + organic whole milk  Lunch: chicken noodle soup OR left overs OR raw vegetables OR canned tuna + sweet potato   Dinner: cooked vegetables (carrots, lima beans, corn, butternut squash, cucumbers, sweet potatoes, white potatoes) + beans several times per  week  Snacks: pretzel, nuts, dried chickpeas, apple, and peanut  Caffeine: none for past 6 years, rarely chocolate  Water: refrigerator filter at home    MOVEMENT:     IBS  yoga nightly  Aiming for to 150 minutes of exercise outside of her typical workday and notes improvement in energy with this  Elliptical, weights, walking dog    SLEEP/ ENERGY:     Good    SOCIAL:     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 Negative for activity change.   HENT: Negative for congestion, postnasal drip, sinus pressure and sore throat.    Gastrointestinal: Positive for diarrhea, constipation and abdominal distention. Negative for nausea, abdominal pain and blood in stool.   Endocrine: Positive for cold intolerance.   Genitourinary: Negative for dysuria, urgency, frequency and pelvic pain.   Psychiatric/Behavioral: Positive for dysphoric mood. Negative for suicidal ideas, sleep disturbance and self-injury. The patient is nervous/anxious.          OBJECTIVE:      Physical Exam      Signed,    Carol Ada, ND, MSOM, LAc  07/12/2015    [facility]  Naturopathic Doctor and Acupuncturist  Supervising physician: Dr. Sophronia Simas (NPI # [id number])  [phone]

## 2015-07-09 NOTE — Patient Instructions (Signed)
RECOMMENDATIONS    DIET  Bfast: eggs/ sausage; berries/ low FODMAP fruit; low FODMAP cooked veggies or soup  Lunch: low FODMAP soup + salad  Dinner: lean protein/ fish + low FODMAP veggies  Snack: low FODMAP nuts/ fruit/ veggies    SUPPLEMENTS  Twice Daily Multi by [employer] (overall support): take 1 cap 2x per day with meals.  Restore (GNC or online) (heal gut lining/ restore gut ecology): 1 tsp to take before or with meals.  Vitamin C (adrenal and immune support): 500mg  2x per day.  N-Acetylcysteine by [employer] (hormonal and mood support): 900mg  taken twice daily.    MINDFULNESS:  HeadSpace meditation 10 minutes per day  Consider Transcendental Meditation    FOLLOW-UP:  Two weeks after getting labwork

## 2015-07-15 MED ORDER — EPINEPHRINE 0.3 MG/0.3ML IJ SOAJ
0.3000 mg | Freq: Once | INTRAMUSCULAR | Status: AC
Start: 2015-07-15 — End: 2015-07-15

## 2015-07-16 ENCOUNTER — Encounter (INDEPENDENT_AMBULATORY_CARE_PROVIDER_SITE_OTHER): Payer: Self-pay | Admitting: Internal Medicine

## 2015-07-16 ENCOUNTER — Encounter (INDEPENDENT_AMBULATORY_CARE_PROVIDER_SITE_OTHER): Payer: Self-pay | Admitting: Acupuncturist

## 2015-07-17 ENCOUNTER — Other Ambulatory Visit (INDEPENDENT_AMBULATORY_CARE_PROVIDER_SITE_OTHER): Payer: Self-pay | Admitting: Internal Medicine

## 2015-07-17 MED ORDER — NORETHIN ACE-ETH ESTRAD-FE 1-20 MG-MCG PO TABS
1.0000 | ORAL_TABLET | Freq: Every day | ORAL | Status: DC
Start: 2015-07-17 — End: 2015-11-30

## 2015-07-17 NOTE — Telephone Encounter (Signed)
lv 06/02/15

## 2015-07-18 MED ORDER — NORETHIN ACE-ETH ESTRAD-FE 1-20 MG-MCG PO TABS
1.0000 | ORAL_TABLET | Freq: Every day | ORAL | Status: DC
Start: 2015-07-18 — End: 2015-09-17

## 2015-07-18 NOTE — Telephone Encounter (Signed)
lv 06/02/15

## 2015-07-19 ENCOUNTER — Ambulatory Visit (INDEPENDENT_AMBULATORY_CARE_PROVIDER_SITE_OTHER): Payer: PRIVATE HEALTH INSURANCE

## 2015-07-19 ENCOUNTER — Telehealth (INDEPENDENT_AMBULATORY_CARE_PROVIDER_SITE_OTHER): Payer: Self-pay | Admitting: Family Medicine

## 2015-07-19 DIAGNOSIS — F419 Anxiety disorder, unspecified: Secondary | ICD-10-CM

## 2015-07-19 DIAGNOSIS — L509 Urticaria, unspecified: Secondary | ICD-10-CM

## 2015-07-19 DIAGNOSIS — R5383 Other fatigue: Secondary | ICD-10-CM

## 2015-07-19 DIAGNOSIS — Z6841 Body Mass Index (BMI) 40.0 and over, adult: Secondary | ICD-10-CM

## 2015-07-19 DIAGNOSIS — K582 Mixed irritable bowel syndrome: Secondary | ICD-10-CM

## 2015-07-19 NOTE — Progress Notes (Signed)
Sent FIT test per [place] / Dr Marcille Blanco.  [employer] Pick-up Confirmation #: [id number]  Tracking [id number]    Verbal consent obtained. Labs drawn by [facility]. Patient tolerated well.

## 2015-07-19 NOTE — Telephone Encounter (Addendum)
Patient came in today (07/19/2015,Saturday) for BW and FIT test per Carol Ada    Dr Marcille Blanco,  Can you please sign FIT test order - it's on your desk.    Lamar Laundry,  Can you please fax the order to FIT and scan   Fax: [phone]    Thanks.    [employer] Confirmation #[id number]

## 2015-07-20 LAB — HOMOCYSTEINE, SERUM: Homocysteine: 7.3 umol/L (ref 0.0–15.0)

## 2015-07-21 LAB — ANTI-THYROID ANTIBODIES
THYROGLOBULIN AB: <1 IU/mL (ref 0.0–0.9)
Thyroid Peroxidase (TPO) AB: 8 IU/mL (ref 0–34)

## 2015-07-21 LAB — NMR LIPOPROFILE WITHOUT INSULIN RESITANCE SCORE
Cholesterol: 276 mg/dL — ABNORMAL HIGH (ref 100–199)
HDL-C: 48 mg/dL (ref >39)
HDL-P (TOTAL): 31.5 umol/L (ref >=30.5)
LDL SIZE: 20.4 nm (ref >20.5)
LDL-C (CALCULATED): 176 mg/dL — ABNORMAL HIGH (ref 0–99)
LDL-P: 2166 nmol/L — ABNORMAL HIGH (ref <1000)
SMALL LDL-P: 1011 nmol/L — ABNORMAL HIGH (ref <=527)
Triglycerides: 259 mg/dL — ABNORMAL HIGH (ref 0–149)

## 2015-07-25 LAB — MTHFR MUTATION

## 2015-07-25 NOTE — Telephone Encounter (Signed)
Order was faxed to [phone] on Wednesday 07/23/15.

## 2015-07-28 ENCOUNTER — Encounter (INDEPENDENT_AMBULATORY_CARE_PROVIDER_SITE_OTHER): Payer: Self-pay | Admitting: Acupuncturist

## 2015-07-28 ENCOUNTER — Encounter (INDEPENDENT_AMBULATORY_CARE_PROVIDER_SITE_OTHER): Payer: PRIVATE HEALTH INSURANCE | Admitting: Acupuncturist

## 2015-08-04 ENCOUNTER — Ambulatory Visit (INDEPENDENT_AMBULATORY_CARE_PROVIDER_SITE_OTHER): Payer: Self-pay | Admitting: Acupuncturist

## 2015-08-04 DIAGNOSIS — K6389 Other specified diseases of intestine: Secondary | ICD-10-CM

## 2015-08-04 DIAGNOSIS — E282 Polycystic ovarian syndrome: Secondary | ICD-10-CM

## 2015-08-04 DIAGNOSIS — K582 Mixed irritable bowel syndrome: Secondary | ICD-10-CM

## 2015-08-04 NOTE — Patient Instructions (Signed)
RECOMMENDATIONS    DIET  Start elimination diet based on Food Inflammation Testing (see handout)  Bfast: eggs/ sausage; berries/ low FODMAP fruit; low FODMAP cooked veggies or soup  Lunch: low FODMAP soup + salad  Dinner: lean protein/ fish + low FODMAP veggies  Snack: low FODMAP nuts/ fruit/ veggies    SUPPLEMENTS  Twice Daily Multi by [employer] (overall support): take 1 cap 2x per day with meals.  Restore (GNC or online) (heal gut lining/ restore gut ecology): 1 tsp to take before or with meals.  Vitamin C (adrenal and immune support): 500mg  2x per day.  N-Acetylcysteine by [employer] (hormonal and mood support): 900mg  taken twice daily.    MINDFULNESS:  HeadSpace meditation 10 minutes per day  Consider Transcendental Meditation

## 2015-08-04 NOTE — Progress Notes (Signed)
NATUROPATHIC INITIAL PART 2 - PROGRESS NOTE:        Patient: Audrey Franklin   Date/ Time: 08/04/2015 9:33 PM    MRN: [id number]   PCP: Carney Bern, MD        ASSESSMENT:   1. Irritable bowel syndrome with both constipation and diarrhea    2. Small intestinal bacterial overgrowth    3. PCOS (polycystic ovarian syndrome)      PLAN:     RECOMMENDATIONS    DIET  Start elimination diet based on Food Inflammation Testing (see handout)  Bfast: eggs/ sausage; berries/ low FODMAP fruit; low FODMAP cooked veggies or soup  Lunch: low FODMAP soup + salad  Dinner: lean protein/ fish + low FODMAP veggies  Snack: low FODMAP nuts/ fruit/ veggies    SUPPLEMENTS  Twice Daily Multi by [employer] (overall support): take 1 cap 2x per day with meals.  Restore (GNC or online) (heal gut lining/ restore gut ecology): 1 tsp to take before or with meals.  Vitamin C (adrenal and immune support): 500mg  2x per day.  N-Acetylcysteine by [employer] (hormonal and mood support): 900mg  taken twice daily.    MINDFULNESS:  HeadSpace meditation 10 minutes per day  Consider Transcendental Meditation    FOLLOW-UP:     End of Jan/ early Feb 2017    CHIEF COMPLAINT:     Chief Complaint   Patient presents with   . Irritable Bowel Syndrome   . PCOS       HEALTH GOALS:   1. Resolve IBS  2. Balance hormones  3. Get back in to balance with both physical/ mental health    HPI:     HPI      Audrey Franklin is a 24 y.o. 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 Has started to avoid foods that were positive on this testing, while also avoiding FODMAPS and notes that diarrhea and stomach pains improved significantly. Has not been fully adherent to this, but plans to start stricter adherence in the New Year.    No improvement in hives, still occuring as before, but now also around mouth. Denies any swelling or itching in her  mouth. Notes increased stress at her work and also found out that she will be laid off. Some stress with prospect of being unemployed, has started job Financial controller. Is looking forward to shorter commute and also is also planning to take some college classes. Living with parents who are trying to help her out as she is recovering from health issues. Discuss possible relation of hives to stress. She will follow with her PCP if no further improvements with dietary changes or decreased stress in a few weeks or if sx worsen.    FIT Test results reveal:  Severe Reaction (4+): wheat gluten; green pea; black tea   High Reaction (3+): cauliflower   Moderate Reaction (2+): cow's milk; rye; whole wheat; grapefruit; honeydew melon; peppermint; salmon; clam   Mild Reaction (1+): casein (cow milk); egg yolk; egg white; apple; blueberry; raspberry; strawberry; polysorbate 80; asparagus; broccoli; lettuce; chick pea; spinach; kidney beans; basil; garlic; chili pepper; peppermint; beef; Mittman; cola nut; English walnut; pecan     History/ Note from initial visit:    Over past two years has gained 40 lbs.    Started college fall 2011 and graduated. States that she was in good  health until summer 2014 health issues started and slowly progressed, weight issues, GI issues, and was eventually "unable to function" due to anxiety and depression.    IBS  August 2015 abN EKG and blood in stool. Saw GI Dr. Allena Katz at [facility].  Colonoscopy Sept 2015, pre-cancerous polyp was removed.  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   Stomach/ upper intestinal inflammation, she thinks.  SIBO breath testing was +, took Rifaximin and two other bowel specific antibiotics after this, failed tx. Tried med to slow transit time, but no change in sx.   Was referred to [facility] by Dr. Allena Katz.   After waiting about 6 months to be evaluated at [facility] and states that she had a bad experience, was hoping for additional tx options for her GI issues and was told to see a  counselor and loose weight. States anxiety and depression got much worse after this b/c she had placed so much hope into getting help with GI issues at [facility].  Tried elimination diet Sept to Jan 2014: wheat, corn, rice, tomatoes, white potatoes, soy. Elimination was based on food sensitivity testing.   Has not yet tried eliminating diary. States that FODMAPS diet was mentioned by GI but she has not been given guidance on this or tired it.   Current GI sx:  -More loose stools, sudden and intense BMs, urgency. Sometimes urgent BM triggered during middle of a meal.  -Belching, gas, bloating. Worse after eating and at night.   -Stools mostly brown to light brown and watery, sometimes orange. Sometimes undigested food.  Takes long time to fully evacuate  Worse sx with anxiety/ nervousness  Denies acid reflux.  Denies foul smell to gas.    PCOS  Spring 2015 dx with PCOS. Started on OCP at this time.  Responded well to OCP, dysmenorrhea and menorrhagia, mood swings and acne resolved. Started metformin 500mg  1x per day and tolerating well, thinks she notices loss of weight in her face.  Notes that cortisols were found to be low.    HIVES  Started two weeks ago  Appearing every morning, gone by evening  Arms, legs, scalp, genitials, feet, hands  Hands all day this past Saturday  In mouth 1 week ago, saw PCP after this for evaluation  Needs refill for Epipen due to hx of anaphylltic allergies (venomous insects)    Hx of anxiety and depression since 2014. + SI at that time, but states stable at present, denies SI, meets with therapist 2x per week.       MEDICATIONS:     Current outpatient prescriptions:   .  ALPRAZolam (XANAX XR) 0.5 MG 24 hr tablet, Take 0.5 mg by mouth every morning., Disp: , Rfl:   .  atomoxetine (STRATTERA) 60 MG capsule, Take 60 mg by mouth daily., Disp: , Rfl:   .  buPROPion XL (WELLBUTRIN XL) 150 MG 24 hr tablet, Take 300 mg by mouth 2 (two) times daily.  , Disp: , Rfl:   .  clobetasol (TEMOVATE) 0.05 %  cream, Apply 1 application topically 2 (two) times daily., Disp: , Rfl:   .  fluticasone (FLONASE) 50 MCG/ACT nasal spray, 1 spray by Nasal route daily., Disp: , Rfl:   .  JUNEL 1/20 1-20 MG-MCG per tablet, TAKE 1 TABLET ONCE A DAY, Disp: , Rfl: 6  .  metFORMIN (GLUMETZA) 500 MG (MOD) 24 hr tablet, Take 1 tablet (500 mg total) by mouth daily with dinner., Disp: 90 tablet, Rfl: 1  .  norethindrone-ethinyl estradiol (JUNEL FE 1/20) 1-20 MG-MCG per tablet, Take 1 tablet by mouth daily., Disp: 90 tablet, Rfl: 1  .  Vilazodone HCl (VIIBRYD) 20 MG Tab, Take by mouth., Disp: , Rfl:     ALLERGIES:     Allergies   Allergen Reactions   . Bee Pollen    . Ceclor [Cefaclor] Hives       NUTRITION:     General: whole foods diet, minimal processed food; avoids bananas due to allergies; rarely eats meat/ fish due to environmental concerns and doesn't know how to cook it well   Breakfast: started eating chicken sausage for bfast  Lunch: chicken + low FODMAP veggies  Dinner: lean meat + low FODMAP veggies  Caffeine: none for past 6 years, rarely chocolate  Water: refrigerator filter at home    MOVEMENT:     IBS yoga nightly  Aiming for to 150 minutes of exercise outside of her typical workday and notes improvement in energy with this  Elliptical, weights, walking dog    SLEEP/ ENERGY:     Good    SOCIAL:     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 Negative for activity change.   HENT: Negative for congestion, postnasal drip, sinus pressure and sore throat.    Gastrointestinal: Positive for diarrhea, constipation and abdominal distention. Negative for nausea, abdominal pain and blood in stool.   Endocrine: Positive for cold intolerance.    Genitourinary: Negative for dysuria, urgency, frequency and pelvic pain.   Psychiatric/Behavioral: Positive for dysphoric mood. Negative for suicidal ideas, sleep disturbance and self-injury. The patient is nervous/anxious.          OBJECTIVE:      Physical Exam   Constitutional: She appears well-developed and well-nourished. No distress.   Skin: She is not diaphoretic.   Psychiatric: She has a normal mood and affect. Her behavior is normal. Judgment and thought content normal.         Signed,    Carol Ada, ND, MSOM, LAc  08/04/2015    [facility]  Naturopathic Doctor and Acupuncturist  Supervising physician: Dr. Sophronia Simas (NPI # [id number])  [phone]

## 2015-08-08 ENCOUNTER — Encounter (INDEPENDENT_AMBULATORY_CARE_PROVIDER_SITE_OTHER): Payer: Self-pay | Admitting: Acupuncturist

## 2015-08-12 ENCOUNTER — Encounter (INDEPENDENT_AMBULATORY_CARE_PROVIDER_SITE_OTHER): Payer: Self-pay | Admitting: Family

## 2015-08-12 ENCOUNTER — Ambulatory Visit (INDEPENDENT_AMBULATORY_CARE_PROVIDER_SITE_OTHER): Payer: PRIVATE HEALTH INSURANCE | Admitting: Family

## 2015-08-12 VITALS — BP 136/88 | HR 105 | Temp 98.0°F | Resp 16

## 2015-08-12 DIAGNOSIS — T7840XA Allergy, unspecified, initial encounter: Secondary | ICD-10-CM

## 2015-08-12 MED ORDER — DIPHENHYDRAMINE HCL 25 MG PO TABS
50.0000 mg | ORAL_TABLET | Freq: Four times a day (QID) | ORAL | Status: DC | PRN
Start: 2015-08-12 — End: 2015-08-12

## 2015-08-12 MED ORDER — PREDNISONE 20 MG PO TABS
ORAL_TABLET | ORAL | Status: DC
Start: 2015-08-12 — End: 2015-09-11

## 2015-08-12 MED ORDER — METHYLPREDNISOLONE SODIUM SUCC 125 MG IJ SOLR
125.0000 mg | Freq: Once | INTRAMUSCULAR | Status: AC
Start: 2015-08-12 — End: 2015-08-12
  Administered 2015-08-12: 125 mg via INTRAMUSCULAR

## 2015-08-12 NOTE — Progress Notes (Signed)
[facility]    PROGRESS NOTE      Patient: Audrey Franklin   Date: 08/12/2015   MRN: [id number]     Past Medical History   Diagnosis Date   . Heartburn    . Anxiety    . ADHD (attention deficit hyperactivity disorder)    . Eczema    . Tubular adenoma    . Small intestinal bacterial overgrowth    . Fatigue    . PCOS (polycystic ovarian syndrome)    . Kidney stones      Social History     Social History   . Marital Status: Single     Spouse Name: N/A   . Number of Children: N/A   . Years of Education: N/A     Occupational History   . Not on file.     Social History Main Topics   . Smoking status: Never Smoker    . Smokeless tobacco: Never Used   . Alcohol Use: No   . Drug Use: No   . Sexual Activity: No     Other Topics Concern   . Not on file     Social History Narrative     Family History   Problem Relation Age of Onset   . Diabetes Mother    . Hypertension Mother    . Cancer Paternal Aunt    . Stroke Paternal Grandfather        ASSESSMENT/PLAN     Audrey Franklin is a 24 y.o. female    Chief Complaint   Patient presents with   . Allergic Reaction        1. Allergic reaction, initial encounter  - predniSONE (DELTASONE) 20 MG tablet; 3 tabs PO daily x 3 days, then 2 tabs PO daily x 3 days, then 1 tab PO daily x 3 days, then 1/2 tab PO daily x 3 days then off  Dispense: 20 tablet; Refill: 0  - methylPREDNISolone sodium succinate (Solu-MEDROL) injection 125 mg; Inject 2 mLs (125 mg total) into the muscle once.    Pt will take benadryl at home. Due to sedating affects of benadryl, pt is advised to take it at home. Additionally pt is advised to take daily antihistamine such as claritin, zyrtec.  Pt is advised to FU with allergy specialist.   All questions are answered, pt agrees to plan.  Ddx: angioedema, flushing, wheezing, pruritis, hives.     Patient Instructions     Allergic Reaction,Generalized [Other]  You are having an allergic reaction. This may cause an itchy rash, dizziness, fainting, trouble breathing  or swallowing, and swelling of the face or other parts of the body.  This can be caused by exposure to something in your surroundings that you have become sensitive to. This could be due to medicine or food. This could also be due to something you put on your skin or in your hair or something in the air. Often it is not possible to find out exactly what has caused your reaction.  The goal of today's treatment is to relieve symptoms. The rash will usually fade over several days, but can sometimes last up to two weeks.  Home Care:  1) If you know what you are allergic to, avoid it because future reactions could be worse than this one.  2) Avoid tight clothing and anything that heats up your skin (hot showers/baths, direct sunlight) since heat will make itching worse.  3) An ice pack will relieve local  areas of intense itching and redness. Lanacaine cream or Solarcaine spray (or other product containing "benzocaine", available without a prescription) will reduce the itching.  4) Oral Benadryl (diphenhydramine) is an antihistamine available at drug and grocery stores. Unless a prescription antihistamine was given, Benadryl may be used to reduce itching if large areas of the skin are involved. Use lower doses during the daytime and higher doses at bedtime since the drug may make you sleepy. [NOTE: Do not use Benadryl if you have glaucoma or if you are a man with trouble urinating due to an enlarged prostate.] Claritin (loratidine) is an antihistamine that causes less drowsiness and is a good alternative for daytime use.  Follow Up  Follow Up with your doctor or this facility in two days if your symptoms do not continue to improve. If you had a severe reaction today, or if you have had several mild-moderate allergic reactions in the past, ask your doctor about allergy testing to find out what you are allergic to. If your reaction included dizziness, fainting or trouble breathing or swallowing, ask your doctor about  carrying an Allergy Kit (injectable epinephrine) for home use.  Get Prompt Medical Attention if any of the following occur:  -- Trouble breathing or swallowing  -- New or worse swelling in the face, eyelids, lips, mouth, tongue or throat  -- Dizziness, weakness or fainting   2000-2015 [employer] [address], [place], [place] [address]. All rights reserved. This information is not intended as a substitute for professional medical care. Always follow your healthcare professional's instructions.              Results for orders placed or performed in visit on 07/19/15   NMR LipoProfile without IR Score   Result Value Ref Range    LDL-P 2166 (H) <1000 nmol/L    LDL-C (CALCULATED) 176 (H) 0 - 99 mg/dL    HDL-C 48 >60 mg/dL    Triglycerides 454 (H) 0 - 149 mg/dL    Cholesterol 098 (H) 100 - 199 mg/dL    HDL-P (TOTAL) 11.9 >=14.7 umol/L    SMALL LDL-P 1011 (H) <=527 nmol/L    LDL SIZE 20.4 >20.5 nm   Homocysteine, serum   Result Value Ref Range    Homocysteine 7.3 0.0 - 15.0 umol/L   Anti-Thyroid Antibodies   Result Value Ref Range    Thyroid Peroxidase (TPO) AB 8 0 - 34 IU/mL    THYROGLOBULIN AB <1.0 0.0 - 0.9 IU/mL   MTHFR mutation   Result Value Ref Range    MTHFR Comment     Additional Information: Comment     References: Comment        Risk & Benefits of the new medication(s) were explained to the patient (and family) who verbalized understanding & agreed to the treatment plan. Patient (family) encouraged to contact me/clinical staff with any questions/concerns      MEDICATIONS     Current Outpatient Prescriptions   Medication Sig Dispense Refill   . ALPRAZolam (XANAX XR) 0.5 MG 24 hr tablet Take 0.5 mg by mouth every morning.     Marland Kitchen atomoxetine (STRATTERA) 60 MG capsule Take 60 mg by mouth daily.     Marland Kitchen buPROPion XL (WELLBUTRIN XL) 150 MG 24 hr tablet Take 300 mg by mouth 2 (two) times daily.        . clobetasol (TEMOVATE) 0.05 % cream Apply 1 application topically 2 (two) times daily.     Marland Kitchen  fluticasone (FLONASE) 50 MCG/ACT nasal spray 1 spray by Nasal route daily.     Colleen Can 1/20 1-20 MG-MCG per tablet TAKE 1 TABLET ONCE A DAY  6   . metFORMIN (GLUMETZA) 500 MG (MOD) 24 hr tablet Take 1 tablet (500 mg total) by mouth daily with dinner. 90 tablet 1   . norethindrone-ethinyl estradiol (JUNEL FE 1/20) 1-20 MG-MCG per tablet Take 1 tablet by mouth daily. 90 tablet 1   . Vilazodone HCl (VIIBRYD) 20 MG Tab Take by mouth.     . predniSONE (DELTASONE) 20 MG tablet 3 tabs PO daily x 3 days, then 2 tabs PO daily x 3 days, then 1 tab PO daily x 3 days, then 1/2 tab PO daily x 3 days then off 20 tablet 0     No current facility-administered medications for this visit.       Allergies   Allergen Reactions   . Bee Pollen    . Ceclor [Cefaclor] Hives       SUBJECTIVE     Chief Complaint   Patient presents with   . Allergic Reaction        HPI Comments: Pt reports having hives on and off for about two months. She states that she's had hives on her lips before, however this time her lip swelling has increased since it started today.    Pt states she's been having anxiety/panic attacks symptoms today. She is unsure if this was a true panic attack or an allergy reaction.    Allergic Reaction  This is a new problem. Episode onset: 3-4 hours ago. The problem occurs constantly. The problem has been gradually worsening since onset. It is unknown what she was exposed to. Pertinent negatives include no difficulty breathing or trouble swallowing. (Finger swelling; hives; lip feels tingly) Swelling location: fingers. Past treatments include nothing.       ROS     Review of Systems   Constitutional: Negative.    HENT: Positive for facial swelling. Negative for trouble swallowing.    Respiratory: Negative for shortness of breath.    Cardiovascular: Negative.    Gastrointestinal: Negative.    Endocrine: Negative.    Musculoskeletal: Negative.    Skin: Positive for color change.   Allergic/Immunologic: Positive for environmental  allergies.   Neurological: Negative.    Hematological: Negative.    Psychiatric/Behavioral: Negative.        The following portions of the patient's history were reviewed and updated as appropriate: Allergies, Current Medications, Past Family History, Past Medical history, Past social history, Past surgical history, and Problem List.    PHYSICAL EXAM     Filed Vitals:    08/12/15 1714   BP: 136/88   Pulse: 105   Temp: 98 F (36.7 C)   TempSrc: Oral   Resp: 16   SpO2: 98%       Physical Exam   Nursing note and vitals reviewed.  Constitutional: She is oriented to person, place, and time. She appears well-developed and well-nourished.   HENT:   Head: Normocephalic and atraumatic.   Right Ear: Hearing, tympanic membrane, external ear and ear canal normal.   Left Ear: Hearing, tympanic membrane, external ear and ear canal normal.   Nose:       Mouth/Throat: Uvula is midline, oropharynx is clear and moist and mucous membranes are normal.   Neurological: She is alert and oriented to person, place, and time.   Skin: Skin is warm and dry.   Psychiatric:  She has a normal mood and affect. Her behavior is normal. Judgment and thought content normal.     Ortho Exam  Neurologic Exam     Mental Status   Oriented to person, place, and time.       PROCEDURE(S)     Procedures        Signed,  Eliezer Lofts, NP  08/12/2015

## 2015-08-12 NOTE — Patient Instructions (Signed)
Allergic Reaction,Generalized [Other]  You are having an allergic reaction. This may cause an itchy rash, dizziness, fainting, trouble breathing or swallowing, and swelling of the face or other parts of the body.  This can be caused by exposure to something in your surroundings that you have become sensitive to. This could be due to medicine or food. This could also be due to something you put on your skin or in your hair or something in the air. Often it is not possible to find out exactly what has caused your reaction.  The goal of today's treatment is to relieve symptoms. The rash will usually fade over several days, but can sometimes last up to two weeks.  Home Care:  1) If you know what you are allergic to, avoid it because future reactions could be worse than this one.  2) Avoid tight clothing and anything that heats up your skin (hot showers/baths, direct sunlight) since heat will make itching worse.  3) An ice pack will relieve local areas of intense itching and redness. Lanacaine cream or Solarcaine spray (or other product containing "benzocaine", available without a prescription) will reduce the itching.  4) Oral Benadryl (diphenhydramine) is an antihistamine available at drug and grocery stores. Unless a prescription antihistamine was given, Benadryl may be used to reduce itching if large areas of the skin are involved. Use lower doses during the daytime and higher doses at bedtime since the drug may make you sleepy. [NOTE: Do not use Benadryl if you have glaucoma or if you are a man with trouble urinating due to an enlarged prostate.] Claritin (loratidine) is an antihistamine that causes less drowsiness and is a good alternative for daytime use.  Follow Up  Follow Up with your doctor or this facility in two days if your symptoms do not continue to improve. If you had a severe reaction today, or if you have had several mild-moderate allergic reactions in the past, ask your doctor about allergy testing  to find out what you are allergic to. If your reaction included dizziness, fainting or trouble breathing or swallowing, ask your doctor about carrying an Allergy Kit (injectable epinephrine) for home use.  Get Prompt Medical Attention if any of the following occur:  -- Trouble breathing or swallowing  -- New or worse swelling in the face, eyelids, lips, mouth, tongue or throat  -- Dizziness, weakness or fainting   2000-2015 [employer] [address], [place] [address]. All rights reserved. This information is not intended as a substitute for professional medical care. Always follow your healthcare professional's instructions.

## 2015-09-11 ENCOUNTER — Ambulatory Visit (INDEPENDENT_AMBULATORY_CARE_PROVIDER_SITE_OTHER): Payer: Self-pay | Admitting: Acupuncturist

## 2015-09-11 ENCOUNTER — Telehealth (INDEPENDENT_AMBULATORY_CARE_PROVIDER_SITE_OTHER): Payer: Self-pay | Admitting: Acupuncturist

## 2015-09-11 DIAGNOSIS — K6389 Other specified diseases of intestine: Secondary | ICD-10-CM

## 2015-09-11 DIAGNOSIS — E282 Polycystic ovarian syndrome: Secondary | ICD-10-CM

## 2015-09-11 DIAGNOSIS — F419 Anxiety disorder, unspecified: Secondary | ICD-10-CM

## 2015-09-11 DIAGNOSIS — K582 Mixed irritable bowel syndrome: Secondary | ICD-10-CM

## 2015-09-11 DIAGNOSIS — F331 Major depressive disorder, recurrent, moderate: Secondary | ICD-10-CM

## 2015-09-11 DIAGNOSIS — Z6841 Body Mass Index (BMI) 40.0 and over, adult: Secondary | ICD-10-CM

## 2015-09-11 NOTE — Patient Instructions (Addendum)
RECOMMENDATIONS    DIET   Food challenge with eggs as discussed (D/C anti-histamines, vit C, and N-acetylcysteine). 3 days ahead of this challenge, have Epipen at hand, challenge with 4 cooked egg whites, eaten 2 hours away from other food. Make sure to do this challenge with someone present. With any reaction after eating the eggs can take 1/4 tsp of baking soda mixed in warm water and drink this hourly after up to 3 to 4 times, take antihistamine medication, and follow to urgent care/ ER with any sign of anaphylaxis. If no reaction after 1st challenge, do a 2nd challenge later that day, following the same process. After challenge start back on anti-histamines, vitamin C, and N-acetylcysteine as directed.   Strict FODMAP diet, with additional egg, pea/ bean, salmon, and calm avoidance    Rotate other foods from FIT in your diet   Bfast: eggs/ sausage; berries/ low FODMAP fruit; low FODMAP cooked veggies or soup   Lunch: low FODMAP soup + salad   Dinner: lean protein/ fish + low FODMAP veggies   Snack: low FODMAP nuts/ fruit/ veggies    SUPPLEMENTS   Twice Daily Multi by [employer] (overall support): take 1 cap 2x per day with meals.   Restore ([employer] or online) (heal gut lining/ restore gut ecology): 1 tsp to take before or with meals.   Vitamin C (adrenal and immune support): 500mg  2x per day.   N-Acetylcysteine by [employer] (hormonal and mood support): 900mg  taken twice daily.    MINDFULNESS:   HeadSpace meditation 10 minutes per day   Consider Transcendental Meditation    EXERCISE:   Keep up the great work    OTHER:   Continue to follow with therapist and psychiatrist for mental health support.   Consider incorporating emotional freedom technique, will send resources via MyChart message on this.

## 2015-09-11 NOTE — Telephone Encounter (Signed)
Please call allergist and request consultation note and copies of all recent lab work and allergy testing that has been done in 2016/ 2017. Dr. Hendricks Milo. Phone: [phone].    Thank you, Maralyn Sago

## 2015-09-11 NOTE — Progress Notes (Signed)
NATUROPATHIC FOLLOW-UP VISIT (30 MINS) - PROGRESS NOTE:        Patient: Audrey Franklin   Date/ Time: 09/11/2015 10:47 AM    MRN: [id number]   PCP: Carney Bern, MD        ASSESSMENT:   1. Small intestinal bacterial overgrowth    2. Irritable bowel syndrome with both constipation and diarrhea    3. PCOS (polycystic ovarian syndrome)    4. Morbid obesity with BMI of 40.0-44.9, adult    5. Anxiety    6. Moderate episode of recurrent major depressive disorder      PLAN:     RECOMMENDATIONS    DIET   Food challenge with eggs as discussed (D/C anti-histamines, vit C, and N-acetylcysteine). 3 days ahead of this challenge, have Epipen at hand, challenge with 4 cooked egg whites, eaten 2 hours away from other food. Make sure to do this challenge with someone present. With any reaction after eating the eggs can take 1/4 tsp of baking soda mixed in warm water and drink this hourly after up to 3 to 4 times, take antihistamine medication, and follow to urgent care/ ER with any sign of anaphylaxis. If no reaction after 1st challenge, do a 2nd challenge later that day, following the same process. After challenge start back on anti-histamines, vitamin C, and N-acetylcysteine as directed.   Strict FODMAP diet, with additional egg, pea/ bean, salmon, and calm avoidance    Rotate other foods from FIT in your diet   Bfast: eggs/ sausage; berries/ low FODMAP fruit; low FODMAP cooked veggies or soup   Lunch: low FODMAP soup + salad   Dinner: lean protein/ fish + low FODMAP veggies   Snack: low FODMAP nuts/ fruit/ veggies    SUPPLEMENTS   Twice Daily Multi by [employer] (overall support): take 1 cap 2x per day with meals.   Restore ([employer] or online) (heal gut lining/ restore gut ecology): 1 tsp to take before or with meals.   Vitamin C (adrenal and immune support): 500mg  2x per day.   N-Acetylcysteine by [employer] (hormonal and mood support): 900mg  taken twice daily.    MINDFULNESS:   HeadSpace meditation 10  minutes per day   Consider Transcendental Meditation    EXERCISE:   Keep up the great work    OTHER:   Continue to follow with therapist and psychiatrist for mental health support.   Consider incorporating emotional freedom technique, will send resources via MyChart message on this.    FOLLOW-UP:     End of Jan/ early Feb 2017    CHIEF COMPLAINT:     Chief Complaint   Patient presents with   . SIBO   . Irritable Bowel Syndrome   . Urticaria       HEALTH GOALS:   1. Resolve IBS  2. Balance hormones  3. Get back in to balance with both physical/ mental health    HPI:     HPI      Audrey Franklin is a 25 y.o. female     After initial visit in Nov started following SIBO diet and in doing this started eating eggs daily. Noticed improvements in gas, bloating, urgency, and loose stools overall with FODMAP diet, however, states hives became worse in Dec. Last saw patient Dec 18th and we reviewed FIT testing at that time, which did indicate possible rxn to eggs. She was planning to start elimination/ challenge diet after holidays, so continued to eat the foods +  on FIT testing, including eggs. Dec 24th, severe allergic rxn, hives got worse, mouth swelling, eyes and nose were running, uncontrollable crying, didn't use Epipen. She took Benedryl and went to sleep. Took a few days for sx to resolve. Dec 28th had another episode of swelling around her eyes and hives on her face. Went to urgent care, given two week taper of prednisone. Established with allergist beginning of Jan, Dr. Hendricks Milo, who per pt didn't suspect food allergy, recommended hive prevention with Zyrtec 10 mg BID and if sx work taken 20 mg BID, she was also recommended to take OTC acid blocker, which she was taking consistent until 2.5 weeks but she decided on her own to D/C acid blocker out of concern of long term side effects. No hives since starting daily anti-histamines in early Jan. Allergy testing 2nd week of Jan, thinks only think that showed in  testing was moderate egg allergy. [During this visit i sent a note to ttriage to obtain consult note and allergy results from allergist].    Was fairly consistent with SIBO diet in Nov and states GI sx definitely improve when she is following it. After eating GF bread in restaurant within 20 minutes had gas, bloating and diarrhea. Stopped SIBO diet in Jan and started elimination diet based of FIT testing. Started elimination diet beginning of Jan as well, following pretty consistently, with a few accidentally exposures to some of the foods, esp garlic. Some increase in gas, bloating, BM urgency with this. But states now stools are formed instead of loose and is having daily BMs, BMs are formed pellets. Given complication of taking anti-histamines to prevent hives, we discuss how to proceed. We discuss her eliminating 2 to 4+ foods on FIT, and challenging only with egg whites only and just avoiding other foods and continuing to eat 1+ foods in a rotary fashion.     Psy/ Mental health  Feeling stable. No SI in 4 months. Sees therapist 2x per week. Per pt, psychiatrist would like her to continue on current medications long term since stable and given her history. Currently unemployed, has decreased in stress from this, is taking one class for graduate certificate program. She is exercising regularly and notes this has been helping with her energy.     Female/ PCOS/ obesity  LMP 09/09/15 using pads, 3 pads per day. Clots in the menstrual blood. But denies heavy dysmenorrhea or menorrhagia since starting Junel OCP last April 2015. No weight loss since Dec, suspects due to prednisone usage. Also with facial acne since taking prednisione, no issues with acne previously. Continues with Metformin 500 mg per day. She will follow with Dr. Maeola Harman Feb 16th and discuss higher dosage of Metformin.    FIT Test results of 07/19/15:  Severe Reaction (4+): wheat gluten; green pea; black tea   High Reaction (3+): cauliflower    Moderate Reaction (2+): cow's milk; rye; whole wheat; grapefruit; honeydew melon; peppermint; salmon; clam   Mild Reaction (1+): casein (cow milk); egg yolk; egg white; apple; blueberry; raspberry; strawberry; polysorbate 80; asparagus; broccoli; lettuce; chick pea; spinach; kidney beans; basil; garlic; chili pepper; peppermint; beef; Chaffin; cola nut; English walnut; pecan     MEDICATIONS:     Current outpatient prescriptions:   .  ALPRAZolam (XANAX XR) 0.5 MG 24 hr tablet, Take 0.5 mg by mouth every morning., Disp: , Rfl:   .  atomoxetine (STRATTERA) 60 MG capsule, Take 60 mg by mouth daily., Disp: , Rfl:   .  buPROPion XL (WELLBUTRIN XL) 150 MG 24 hr tablet, Take 300 mg by mouth 2 (two) times daily.  , Disp: , Rfl:   .  cetirizine (ZYRTEC) 10 MG tablet, Take 10 mg by mouth 2 (two) times daily., Disp: , Rfl:   .  clobetasol (TEMOVATE) 0.05 % cream, Apply 1 application topically 2 (two) times daily., Disp: , Rfl:   .  fluticasone (FLONASE) 50 MCG/ACT nasal spray, 1 spray by Nasal route daily., Disp: , Rfl:   .  JUNEL 1/20 1-20 MG-MCG per tablet, TAKE 1 TABLET ONCE A DAY, Disp: , Rfl: 6  .  norethindrone-ethinyl estradiol (JUNEL FE 1/20) 1-20 MG-MCG per tablet, Take 1 tablet by mouth daily., Disp: 90 tablet, Rfl: 1  .  Vilazodone HCl (VIIBRYD) 20 MG Tab, Take by mouth., Disp: , Rfl:     ALLERGIES:     Allergies   Allergen Reactions   . Bee Pollen    . Ceclor [Cefaclor] Hives       NUTRITION:     General: whole foods diet, minimal processed food; avoids bananas due to allergies  Breakfast: eggs or chicken sausage  Lunch: chicken + low FODMAP veggies  Dinner: lean meat + low FODMAP veggies  Caffeine: none for past 6 years, rarely chocolate  Water: refrigerator filter at home    MOVEMENT:     IBS yoga nightly  Aiming for to 150 minutes of exercise outside of her typical workday and notes improvement in energy with this  Elliptical, weights, walking dog    SLEEP/ ENERGY:     Good    SOCIAL:     Work: looking for work,  taking class for graduate certificate program  Relationship: tough break-up last year, not in relationship currently  Stress level: low to moderate  Relaxation Activities/Techniques: counselor 2 x per week    REVIEW OF SYSTEMS:     Review of Systems   Constitutional: Positive for fatigue and unexpected weight change. Negative for activity change.   HENT: Negative for congestion, postnasal drip, sinus pressure and sore throat.    Respiratory: Negative.    Cardiovascular: Negative.    Gastrointestinal: Positive for constipation and abdominal distention. Negative for nausea, abdominal pain, diarrhea and blood in stool.        See HPI   Endocrine: Positive for cold intolerance.   Genitourinary: Negative for dysuria, urgency, frequency and pelvic pain.   Musculoskeletal:        Notes less joint pain resolved since starting on Zyrtec BID in Jan   Skin: Negative for rash.        Rash resolved since starting to take Zyrtec BID   Allergic/Immunologic: Positive for food allergies. Negative for environmental allergies.        Apparently egg allergy with skin prick testing Jan 2017, testing report to be obtained from allergist   Psychiatric/Behavioral: Positive for dysphoric mood. Negative for suicidal ideas, sleep disturbance and self-injury. The patient is nervous/anxious.          OBJECTIVE:      Physical Exam   Constitutional: She appears well-developed and well-nourished. No distress.   Skin: She is not diaphoretic.   Psychiatric: She has a normal mood and affect. Her behavior is normal. Judgment and thought content normal.         Signed,    Carol Ada, ND, MSOM, LAc  09/11/2015    [facility]  Naturopathic Doctor and Acupuncturist  Supervising physician: Dr. Sophronia Simas (NPI # [id number])  [id number]

## 2015-09-17 ENCOUNTER — Encounter (INDEPENDENT_AMBULATORY_CARE_PROVIDER_SITE_OTHER): Payer: Self-pay | Admitting: Family Medicine

## 2015-09-17 ENCOUNTER — Ambulatory Visit (INDEPENDENT_AMBULATORY_CARE_PROVIDER_SITE_OTHER): Payer: PRIVATE HEALTH INSURANCE | Admitting: Family Medicine

## 2015-09-17 VITALS — BP 122/87 | HR 121 | Temp 98.2°F | Resp 16 | Ht 63.0 in | Wt 251.0 lb

## 2015-09-17 DIAGNOSIS — B3731 Acute candidiasis of vulva and vagina: Secondary | ICD-10-CM

## 2015-09-17 DIAGNOSIS — Z01419 Encounter for gynecological examination (general) (routine) without abnormal findings: Secondary | ICD-10-CM

## 2015-09-17 MED ORDER — FLUCONAZOLE 150 MG PO TABS
ORAL_TABLET | ORAL | Status: DC
Start: 2015-09-17 — End: 2016-02-04

## 2015-09-17 NOTE — Progress Notes (Signed)
Subjective:      Date: 09/17/2015 9:48 AM   Patient ID: Audrey Franklin is a 25 y.o. female.    Chief Complaint:  Chief Complaint   Patient presents with   . Gynecologic Exam     not fasting       HPI  Routine Gyn Exam  Patient here for routine exam. Current complaints: none.      Gynecologic History  Patient's last menstrual period was 10/08/2014.  Contraception: none-patient's sexually preferred partner is female. She has never been sexually active with a female partner. She has received all three gardasil vaccines.  Last Pap: none prior.   Last mammogram: none prior. Patient performs regular self exams.    Obstetric History  OB History   No data available           Problem List:  Patient Active Problem List   Diagnosis   . PCOS (polycystic ovarian syndrome)   . Morbid obesity with BMI of 40.0-44.9, adult   . Small intestinal bacterial overgrowth   . Anxiety   . Moderate episode of recurrent major depressive disorder   . Irritable bowel syndrome with both constipation and diarrhea       Current Medications:  Current Outpatient Prescriptions   Medication Sig Dispense Refill   . ALPRAZolam (XANAX XR) 0.5 MG 24 hr tablet Take 0.5 mg by mouth every morning.     Marland Kitchen atomoxetine (STRATTERA) 60 MG capsule Take 60 mg by mouth daily.     Marland Kitchen buPROPion XL (WELLBUTRIN XL) 300 MG 24 hr tablet Take 300 mg by mouth daily.     . cetirizine (ZYRTEC) 10 MG tablet Take 10 mg by mouth 2 (two) times daily.     . clobetasol (TEMOVATE) 0.05 % cream Apply 1 application topically 2 (two) times daily.     . fluticasone (FLONASE) 50 MCG/ACT nasal spray 1 spray by Nasal route daily.     Colleen Can 1/20 1-20 MG-MCG per tablet TAKE 1 TABLET ONCE A DAY  6   . Vilazodone HCl (VIIBRYD) 20 MG Tab Take by mouth.       No current facility-administered medications for this visit.       Allergies:  Allergies   Allergen Reactions   . Bee Pollen    . Ceclor [Cefaclor] Hives   . Fruit & Vegetable Daily [Fish Oil]        Past Medical History:  Past Medical History    Diagnosis Date   . Heartburn    . Anxiety    . ADHD (attention deficit hyperactivity disorder)    . Eczema    . Tubular adenoma    . Small intestinal bacterial overgrowth    . Fatigue    . PCOS (polycystic ovarian syndrome)    . Kidney stones        Past Surgical History:  Past Surgical History   Procedure Laterality Date   . Kidney stone surgery     . Wisdome teeth         Family History:  Family History   Problem Relation Age of Onset   . Diabetes Mother    . Hypertension Mother    . Cancer Paternal Aunt    . Stroke Paternal Grandfather        Social History:  Social History     Social History   . Marital Status: Single     Spouse Name: N/A   . Number of Children: N/A   . Years  of Education: N/A     Occupational History   . Not on file.     Social History Main Topics   . Smoking status: Never Smoker    . Smokeless tobacco: Never Used   . Alcohol Use: No   . Drug Use: No   . Sexual Activity: No     Other Topics Concern   . Not on file     Social History Narrative       The following portions of the patient's history were reviewed and updated as appropriate: allergies, current medications, past family history, past medical history, past social history, past surgical history and problem list.      ROS:   General/Constitutional:   Denies Change in appetite. Denies Chills. Denies Fatigue. Denies Fever.   Ophthalmologic:   Denies Blurred vision. Denies Eye Discharge. Denies Eye Pain.   ENT:   Denies Nasal Discharge. Denies Hoarseness. Denies Ear pain.   Respiratory:   Denies Cough.Denies Shortness of breath. Denies Wheezing.   Cardiovascular:   Denies Chest pain. Denies Chest pain with exertion. Denies Leg Claudication. Denies Palpitations.   Hematology:   Denies Easy bruising. Denies Easy Bleeding.   Genitourinary:   Denies Blood in urine. Denies Nocturia. Denies Painful urination.    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 Denies Change in Mole(s).  Neurologic:   Denies Balance  difficulty. Denies Dizziness. Denies Gait abnormality. Denies Headache. Denies Seizures. Denies Tingling/Numbness.   Psychiatric:   Admits Anxiety. Admits Depressed mood-under good control.      Objective:   BP 122/87 mmHg  Pulse 121  Temp(Src) 98.2 F (36.8 C) (Oral)  Resp 16  Ht 1.6 m (5\' 3" )  Wt 113.853 kg (251 lb)  BMI 44.47 kg/m2  SpO2 98%  LMP 10/08/2014    Examination:   General Examination:   GENERAL APPEARANCE: alert, in no acute distress, well developed, well nourished, oriented to time, place, and person.   HEAD: normal appearance, atraumatic.   EYES: extraocular movement intact (EOMI), pupils equal, round, reactive to light and accommodation, sclera anicteric, conjunctiva clear.   ORAL CAVITY: normal oropharynx, normal lips, mucosa moist, no lesions.   SKIN: good turgor, no rashes, no suspicious lesions.   LUNGS: normal effort / no distress.  BREASTS: no discharge, no masses palpable bilaterally, nontender, inverted nipples present.   FEMALE GENITOURINARY: inner labia with clumpy, white vaginal discharge present; vaginal mucosa pink; patient unable to tolerate speculum exam.   MUSCULOSKELETAL: full range of motion, no swelling or deformity.   EXTREMITIES: no edema, no clubbing, cyanosis.  NEUROLOGIC: nonfocal, cranial nerves 2-12 grossly intact, sensory exam intact.   PSYCH: cognitive function intact, mood/affect full range, speech clear.       Assessment/Plan:       1. Routine gynecological examination  Patient unable to tolerated speculum exam to screen for cervical cancer. Patient is low risk due to lack of sexual activity and adequate immunization with full series of gardasil vaccines. Request patient return in future for screening when she becomes sexually active.    2. Vaginal candidiasis  - fluconazole (DIFLUCAN) 150 MG tablet; Take once, may repeat in 48 hours if no improvement  Dispense: 2 tablet; Refill: 3  Acute, stable-will treat with oral diflucan; patient declines vaginal gel or  suppositories. Side effects include rash, GI upset.        Carney Bern, MD

## 2015-09-29 ENCOUNTER — Encounter (INDEPENDENT_AMBULATORY_CARE_PROVIDER_SITE_OTHER): Payer: Self-pay | Admitting: Acupuncturist

## 2015-10-02 ENCOUNTER — Encounter (INDEPENDENT_AMBULATORY_CARE_PROVIDER_SITE_OTHER): Payer: Self-pay | Admitting: Internal Medicine

## 2015-10-02 ENCOUNTER — Ambulatory Visit (INDEPENDENT_AMBULATORY_CARE_PROVIDER_SITE_OTHER): Payer: PRIVATE HEALTH INSURANCE | Admitting: Internal Medicine

## 2015-10-02 VITALS — BP 129/83 | HR 104 | Temp 98.3°F | Resp 16 | Wt 252.6 lb

## 2015-10-02 DIAGNOSIS — E282 Polycystic ovarian syndrome: Secondary | ICD-10-CM

## 2015-10-02 DIAGNOSIS — Z6841 Body Mass Index (BMI) 40.0 and over, adult: Secondary | ICD-10-CM

## 2015-10-02 DIAGNOSIS — E782 Mixed hyperlipidemia: Secondary | ICD-10-CM

## 2015-10-02 NOTE — Progress Notes (Signed)
Subjective:      Date: 10/02/2015 8:17 AM   Patient ID: Audrey Franklin is a 25 y.o. female.    Chief Complaint:  Chief Complaint   Patient presents with   . PCOS (polycystic ovarian syndrome)       HPI     Patient is here for PCOS . LMP: 02/17/2014 . Patient complains of this last month June, having every other week menstrual periods. Patient complains of continued fatigue , diarrhea has gradually gotten worse. Patient had an appointment at [facility] in September for SIBO. Plans to seek a second opinion.    Has had continued multiple episodes of diarrhea. Seen by Dr. Marcille Blanco this morning. Has an appointment with [facility] for chronic SIBO. Has been treated with multiple rounds of antibiotics and has not noticed any symptoms relief.     Taking junel and reports regular menses on the birth control. Had an unexpected menses after missing a few doses of birth control. Reports interval worsening of acne. No interval worsening of hirsutism and alopecia.    Seen by GI- Dr. Allena Katz for chronic diarrhea. Weight gain of over 30 lbs within the last 1 year. PGM has h/o thyroid dysfunction. Audrey Franklin is a known lesbian and reports good family support.  H/O heavy and irregular menses, improved on birth control.     Reports ongoing episodes of hives. Reports ongoing c/o heavy menses.    Problem List:  Patient Active Problem List   Diagnosis   . PCOS (polycystic ovarian syndrome)   . Morbid obesity with BMI of 40.0-44.9, adult   . Small intestinal bacterial overgrowth   . Anxiety   . Moderate episode of recurrent major depressive disorder   . Irritable bowel syndrome with both constipation and diarrhea       Current Medications:  Current Outpatient Prescriptions   Medication Sig Dispense Refill   . ALPRAZolam (XANAX XR) 0.5 MG 24 hr tablet Take 0.5 mg by mouth every morning.     Marland Kitchen atomoxetine (STRATTERA) 60 MG capsule Take 60 mg by mouth daily.     Marland Kitchen buPROPion XL (WELLBUTRIN XL) 300 MG 24 hr tablet Take 300 mg by mouth daily.     . cetirizine  (ZYRTEC) 10 MG tablet Take 10 mg by mouth 2 (two) times daily.     . clobetasol (TEMOVATE) 0.05 % cream Apply 1 application topically 2 (two) times daily.     . fluticasone (FLONASE) 50 MCG/ACT nasal spray 1 spray by Nasal route daily.     Audrey Franklin 1/20 1-20 MG-MCG per tablet TAKE 1 TABLET ONCE A DAY  6   . Vilazodone HCl (VIIBRYD) 20 MG Tab Take by mouth.     . fluconazole (DIFLUCAN) 150 MG tablet Take once, may repeat in 48 hours if no improvement 2 tablet 3     No current facility-administered medications for this visit.       Allergies:  Allergies   Allergen Reactions   . Bee Pollen    . Ceclor [Cefaclor] Hives   . Fruit & Vegetable Daily [Fish Oil]        Past Medical History:  Past Medical History   Diagnosis Date   . Heartburn    . Anxiety    . ADHD (attention deficit hyperactivity disorder)    . Eczema    . Tubular adenoma    . Small intestinal bacterial overgrowth    . Fatigue    . PCOS (polycystic ovarian syndrome)    .  Kidney stones        Past Surgical History:  Past Surgical History   Procedure Laterality Date   . Kidney stone surgery     . Wisdome teeth         Family History:  Family History   Problem Relation Age of Onset   . Diabetes Mother    . Hypertension Mother    . Cancer Paternal Aunt    . Stroke Paternal Grandfather        Social History:  Social History     Social History   . Marital Status: Single     Spouse Name: N/A   . Number of Children: N/A   . Years of Education: N/A     Occupational History   . Not on file.     Social History Main Topics   . Smoking status: Never Smoker    . Smokeless tobacco: Never Used   . Alcohol Use: No   . Drug Use: No   . Sexual Activity: No     Other Topics Concern   . Not on file     Social History Narrative       The following portions of the patient's history were reviewed and updated as appropriate: allergies, current medications, past family history, past medical history, past social history, past surgical history and problem list.    ROS: A complete 12  point ROS was obtained. Pertinent positives and negatives as noted in HPI. All other systems are negative.    Vitals:  BP 129/83 mmHg  Pulse 104  Temp(Src) 98.3 F (36.8 C)  Resp 16  Wt 114.579 kg (252 lb 9.6 oz)  SpO2 98%  LMP 10/01/2015   Wt Readings from Last 3 Encounters:   10/02/15 114.579 kg (252 lb 9.6 oz)   09/17/15 113.853 kg (251 lb)   07/04/15 112.946 kg (249 lb)     Body mass index is 44.76 kg/(m^2).      Physical Exam   Constitutional: She is oriented to person, place, and time. She appears well-developed and well-nourished.   HENT:   Head: Normocephalic and atraumatic.   Mouth/Throat: Oropharynx is clear and moist.   Eyes: Conjunctivae and EOM are normal. Pupils are equal, round, and reactive to light.   Neck: Normal range of motion. Neck supple. No thyromegaly present.   Cardiovascular: Normal rate, regular rhythm, normal heart sounds and intact distal pulses.    Pulmonary/Chest: Effort normal and breath sounds normal.   Abdominal: Soft. Bowel sounds are normal. She exhibits no distension. There is no tenderness.   Musculoskeletal: Normal range of motion. She exhibits no edema or tenderness.   No tremors on outstretched arms.   Lymphadenopathy:   She has no cervical adenopathy.   Neurological: She is alert and oriented to person, place, and time. She has normal reflexes. No cranial nerve deficit.   Skin: Skin is warm and dry.  No hyperpigmentation, violaceous striae or acanthosis nigricans. + facial acne  Psychiatric: She has a normal mood and affect.     ASSESSMENT AND PLAN    1. PCOS (polycystic ovarian syndrome)  Menses regular on OCPs, interval improvement in acne, hirsutism and alopecia. Discussed that we could do a trial of low dose metformin and assess clinical response; given that her symptoms of diarrhea are stable. Risk & Benefits of the new medication(s) were explained to the patient (and family) who verbalized understanding & agreed to the treatment plan. Patient (family) encouraged  to contact  me/clinical staff with any questions/concerns.    - Comprehensive metabolic panel  - TSH  - T4, free  - Hemoglobin A1C  - DHEA-sulfate  - Testosterone    2. Morbid obesity with BMI of 40.0-44.9, adult  Recommend calorie restricted diet and exercise for weight loss.    3. Body mass index 40.0-44.9, adult    4. Hyperlipidemia  Reports inconsistent diet; discussed that birth control pills could be related to higher cholesterol levels.  Lab Results   Component Value Date    CHOL 276* 07/19/2015     No results found for: HDL  No results found for: LDL  Lab Results   Component Value Date    TRIG 259* 07/19/2015       RTC in 3 months    Tanya Nones MD MPH  Endocrinologist, [facility]

## 2015-10-02 NOTE — Patient Instructions (Signed)
Polycystic Ovary Syndrome  Polycystic ovary syndrome (PCOS) causes harmless, smallcysts in the ovaries and other symptoms. PCOS is caused by certain hormones being out of balance. The word "syndrome" means a group of symptoms. Women with PCOS may have no periods, irregular periods, or very long periods.    Your ovaries  Women store their eggs in their ovaries. Each egg is in a capsule called a follicle. Normally during the reproductive years, one follicle grows to produce a mature egg each month. This egg is released during ovulation and the follicle dissolves.  Hormones out of balance  With polycystic ovary syndrome (PCOS), the hormones that control ovulation are out of balance. These include estrogen, progesterones, and androgen.As a result, ovulation may not occur. Instead, thefollicle stays enlarged. This is a fluid-filled sac (cyst). Over time, the ovaries fill with many small cysts. This is why they are called "poly" (many) "cystic" ovaries. In some women, the ovaries also make toomuch androgen.  PCOS symptoms  Women with PCOS may also have one or more of these symptoms:   Trouble getting pregnant (fertility problems)   Weight gain, especially around the waist   Acne   Hair growth on the face and other parts of the body   Patches of thickened, velvety, darkened skin called acanthosis nigricans  Women with PCOS are also at increased risk of developing cancer of the uterine lining,diabetes, and heart disease.   Date Last Reviewed: 12/23/2013   2000-2016 [employer] [address] [place], [place] [address]. All rights reserved. This information is not intended as a substitute for professional medical care. Always follow your healthcare professional's instructions.

## 2015-10-03 LAB — LIPID PANEL
Cholesterol / HDL Ratio: 4.4 ratio units (ref 0.0–4.4)
Cholesterol: 257 mg/dL — ABNORMAL HIGH (ref 100–199)
HDL: 59 mg/dL (ref >39)
LDL Calculated: 146 mg/dL — ABNORMAL HIGH (ref 0–99)
Triglycerides: 261 mg/dL — ABNORMAL HIGH (ref 0–149)
VLDL Calculated: 52 mg/dL — ABNORMAL HIGH (ref 5–40)

## 2015-10-03 LAB — COMPREHENSIVE METABOLIC PANEL
ALT: 13 IU/L (ref 0–32)
AST (SGOT): 16 IU/L (ref 0–40)
Albumin/Globulin Ratio: 1.6 (ref 1.1–2.5)
Albumin: 4.3 g/dL (ref 3.5–5.5)
Alkaline Phosphatase: 75 IU/L (ref 39–117)
BUN / Creatinine Ratio: 9 (ref 8–20)
BUN: 7 mg/dL (ref 6–20)
Bilirubin, Total: 0.3 mg/dL (ref 0.0–1.2)
CO2: 21 mmol/L (ref 18–29)
Calcium: 9.2 mg/dL (ref 8.7–10.2)
Chloride: 102 mmol/L (ref 96–106)
Creatinine: 0.78 mg/dL (ref 0.57–1.00)
EGFR: 107 mL/min/{1.73_m2} (ref >59)
EGFR: 124 mL/min/{1.73_m2} (ref >59)
Globulin, Total: 2.7 g/dL (ref 1.5–4.5)
Glucose: 94 mg/dL (ref 65–99)
Potassium: 4.3 mmol/L (ref 3.5–5.2)
Protein, Total: 7 g/dL (ref 6.0–8.5)
Sodium: 143 mmol/L (ref 134–144)

## 2015-10-03 LAB — HEMOGLOBIN A1C: Hemoglobin A1C: 5.3 % (ref 4.8–5.6)

## 2015-10-03 LAB — TESTOSTERONE: Testosterone: 18 ng/dL (ref 8–48)

## 2015-10-03 LAB — DHEA-SULFATE: DHEA-SO4: 147.9 ug/dL (ref 110.0–431.7)

## 2015-10-04 LAB — TSH: TSH: 2.89 u[IU]/mL (ref 0.450–4.500)

## 2015-10-04 LAB — T4, FREE: T4, Free: 1.12 ng/dL (ref 0.82–1.77)

## 2015-10-15 ENCOUNTER — Ambulatory Visit (INDEPENDENT_AMBULATORY_CARE_PROVIDER_SITE_OTHER): Payer: Self-pay | Admitting: Acupuncturist

## 2015-10-15 DIAGNOSIS — Z6841 Body Mass Index (BMI) 40.0 and over, adult: Secondary | ICD-10-CM

## 2015-10-15 DIAGNOSIS — K582 Mixed irritable bowel syndrome: Secondary | ICD-10-CM

## 2015-10-15 DIAGNOSIS — L501 Idiopathic urticaria: Secondary | ICD-10-CM | POA: Insufficient documentation

## 2015-10-15 DIAGNOSIS — J301 Allergic rhinitis due to pollen: Secondary | ICD-10-CM | POA: Insufficient documentation

## 2015-10-15 DIAGNOSIS — K6389 Other specified diseases of intestine: Secondary | ICD-10-CM

## 2015-10-15 DIAGNOSIS — F419 Anxiety disorder, unspecified: Secondary | ICD-10-CM

## 2015-10-15 DIAGNOSIS — E782 Mixed hyperlipidemia: Secondary | ICD-10-CM

## 2015-10-15 DIAGNOSIS — E282 Polycystic ovarian syndrome: Secondary | ICD-10-CM

## 2015-10-15 DIAGNOSIS — F331 Major depressive disorder, recurrent, moderate: Secondary | ICD-10-CM

## 2015-10-15 NOTE — Progress Notes (Addendum)
NATUROPATHIC FOLLOW-UP VISIT (30 MINS) - PROGRESS NOTE:        Patient: Audrey Franklin   Date/ Time: 10/15/2015 10:46 AM    MRN: [id number]   PCP: Carney Bern, MD        ASSESSMENT:   1. Small intestinal bacterial overgrowth    2. Irritable bowel syndrome with both constipation and diarrhea    3. PCOS (polycystic ovarian syndrome)    4. Morbid obesity with BMI of 40.0-44.9, adult    5. Mixed hyperlipidemia    6. Anxiety    7. Moderate episode of recurrent major depressive disorder    8. Idiopathic urticaria    9. Seasonal allergic rhinitis due to pollen      PLAN:     RECOMMENDATIONS    DIET   Food challenge wheat, green pea, black tea, and clam.   With any reaction can take 1/4 tsp of baking soda mixed in warm water and drink this hourly after up to 3 to 4 times.   Strict FODMAP diet, with additional egg, pea/ bean, salmon, and calm avoidance    Rotate other foods from FIT in your diet   Bfast: eggs/ sausage; berries/ low FODMAP fruit; low FODMAP cooked veggies or soup   Lunch: low FODMAP soup + salad   Dinner: lean protein/ fish + low FODMAP veggies   Snack: low FODMAP nuts/ fruit/ veggies    SUPPLEMENTS   Tegrecel Colostrum by [employer] (immune support): 1 to 2 caps taken once to three times daily on empty stomach (15 minutes prior to meals or two hours after).   Twice Daily Multi by [employer] (overall support): take 1 cap 2x per day with meals.   Restore ([employer] or online) (heal gut lining/ restore gut ecology): 1 tsp to take before or with meals.   Vitamin C (adrenal and immune support): 500mg  2x per day.   N-Acetylcysteine by [employer] (hormonal and mood support): 900mg  taken twice daily.    MINDFULNESS:   HeadSpace meditation 10 minutes per day   Consider Transcendental Meditation    EXERCISE:   Keep up the great work    OTHER:   Consider asking Dr. Marcille Blanco for referral to [facility] for repeat skin prick testing and possible immunotherapy treatment (they  teach you how to do the injections at home) other possible option might be sublingual immunotherapy drops that I can help with - but not insurance covered about $135 for 3 month supply.   Continue to follow with therapist and psychiatrist for mental health support.   Consider incorporating emotional freedom technique, will send resources via MyChart message on this.   Please give me an update through the MyChart patient portal of your progress mid-March   Consider Comprehensive Digestive Stool Testing next visit    FOLLOW-UP:     May 2017    CHIEF COMPLAINT:     No chief complaint on file.    HEALTH GOALS:   1. Resolve IBS  2. Balance hormones  3. Get back in to balance with both physical/ mental health    HPI:     HPI      Audrey Franklin is a 25 y.o. female     Hives    Hives started again 2nd week of Jan despite continuing on Zyrtec and Pepsin. Saw allergist Feb 14th who recommended continued monitoring and drew CBC, WBC was previously elevated, she is awaiting results. Mid-Feb joint pain in wrist, elbow and  shoulders came back, alternates sides and locations. This had amel with Zyrtec and Pepsin. States that pain was debilitating. However, this pain resolved after a week without her making any particular changes. Notes that she had two periods, LMP 10/04/15, has a period two weeks prior to this around 09/20/15.     PCOS/ Obesity    She did follow with endocrinologist 10/02/15, cholesterol was checked. Discuss OCP, states that she is on it for "hormonal balance", discuss with her that OCPs do not balance hormone, they alter normal hormone production and that sometimes this can help women with PCOS have less sx, however, given headaches, mood issues, and frequent periods I recommend that she follow-up with her endo to discuss other options, including working with me on herbal approaches. She states that she will discuss with endo when she follows up in May.    States that her weight is stable, fluctuates within 7 lbs  and she is pleased that she has stopped gaining. Notes that her energy has improved overall.     IBS/ SIBO    IBS sx much improved. Now just having mild cramping occasionally. Bloating isn't as bad. Loose, not as urgent BMs about 3 times per week. No issues noticed with cauliflower, salmon, grapefruit, honeydew, peppermint, and salmon. No issues with 1+ foods. Not yet challenged with wheat, green pea, black tea or clam, continues to avoid.     She is following a modified FODMAP diet, but has tried and is tolerating small amounts of cooked apple, cooked onion, and corn. Trying to avoid grains. She feels that she is able to adhere to dietary guidelines about 85 to 90% of the time and notes big improvement with this.    She did start food challenge based on Food Inflammation Testing. With egg whites she noticed slight stomach ache several hours after eating it; with lactose free dairy she had bout of loose stools right after eating; didn't notice issues/ sx with hard cheese    Headaches   Moderate to severe every 2 days, bilateral behind eyes, worse right side, cause "fussy vision" worse with wearing glasses. Amel with sleep, lavender, peppermint, eucalyptus. These occur in the afternoon/ evening, but she will awake with the headaches.    MEDICATIONS:     Current outpatient prescriptions:   .  ALPRAZolam (XANAX XR) 0.5 MG 24 hr tablet, Take 0.5 mg by mouth every morning., Disp: , Rfl:   .  atomoxetine (STRATTERA) 60 MG capsule, Take 60 mg by mouth daily., Disp: , Rfl:   .  buPROPion XL (WELLBUTRIN XL) 300 MG 24 hr tablet, Take 300 mg by mouth daily., Disp: , Rfl:   .  cetirizine (ZYRTEC) 10 MG tablet, Take 10 mg by mouth 2 (two) times daily., Disp: , Rfl:   .  clobetasol (TEMOVATE) 0.05 % cream, Apply 1 application topically 2 (two) times daily., Disp: , Rfl:   .  famotidine (PEPCID) 20 MG tablet, Take 20 mg by mouth every 12 (twelve) hours., Disp: , Rfl:   .  fluconazole (DIFLUCAN) 150 MG tablet, Take once, may repeat  in 48 hours if no improvement, Disp: 2 tablet, Rfl: 3  .  fluticasone (FLONASE) 50 MCG/ACT nasal spray, 1 spray by Nasal route daily., Disp: , Rfl:   .  JUNEL 1/20 1-20 MG-MCG per tablet, TAKE 1 TABLET ONCE A DAY, Disp: , Rfl: 6  .  Vilazodone HCl (VIIBRYD) 20 MG Tab, Take by mouth., Disp: , Rfl:  ALLERGIES:     Allergies   Allergen Reactions   . Bee Pollen    . Ceclor [Cefaclor] Hives   . Fruit & Vegetable Daily [Fish Oil]        NUTRITION:     General: whole foods diet, minimal processed food; avoids bananas due to allergies; eating more salads and greens and olives; eating more tomatoes; focused on increase low FODMAP veggies  Breakfast: chicken or chicken sausage or pork   Lunch: chicken + low FODMAP veggies   Dinner: lean meat + low FODMAP veggies  Caffeine: none for past 6 years, rarely chocolate  Water: refrigerator filter at home    MOVEMENT:     IBS yoga nightly  Aiming for to 150 minutes of exercise outside of her typical workday and notes improvement in energy with this  Elliptical, weights, walking dog    SLEEP/ ENERGY:     Good    SOCIAL:     Work: looking for work, taking class for graduate certificate program  Relationship: tough break-up last year, not in relationship currently  Stress level: low to moderate  Relaxation Activities/Techniques: counselor 2 x per week    REVIEW OF SYSTEMS:     Review of Systems   Constitutional: Positive for fatigue. Negative for activity change and unexpected weight change.   HENT: Negative for congestion, postnasal drip, sinus pressure and sore throat.    Respiratory: Negative.    Cardiovascular: Positive for chest pain.        Chest pn, anxiety related   Gastrointestinal: Positive for nausea, constipation and abdominal distention. Negative for abdominal pain, diarrhea and blood in stool.        See HPI   Endocrine: Positive for cold intolerance.   Genitourinary: Negative for dysuria, urgency, frequency and pelvic pain.   Musculoskeletal: Positive for arthralgias.         Notes less joint pain resolved since starting on Zyrtec BID in Jan   Skin: Positive for rash.        See HPI, + acne, + hives   Allergic/Immunologic: Positive for food allergies. Negative for environmental allergies.        Apparently egg allergy with skin prick testing Jan 2017, testing report to be obtained from allergist   Neurological: Positive for headaches.   Psychiatric/Behavioral: Positive for dysphoric mood. Negative for suicidal ideas, sleep disturbance and self-injury. The patient is nervous/anxious.          OBJECTIVE:      Physical Exam   Constitutional: She appears well-developed and well-nourished. No distress.   Skin: She is not diaphoretic.   Psychiatric: She has a normal mood and affect. Her behavior is normal. Judgment and thought content normal.      FIT Test results of 07/19/15:  Severe Reaction (4+): wheat gluten; green pea; black tea   High Reaction (3+): cauliflower   Moderate Reaction (2+): cow's milk; rye; whole wheat; grapefruit; honeydew melon; peppermint; salmon; clam   Mild Reaction (1+): casein (cow milk); egg yolk; egg white; apple; blueberry; raspberry; strawberry; polysorbate 80; asparagus; broccoli; lettuce; chick pea; spinach; kidney beans; basil; garlic; chili pepper; peppermint; beef; [place]; cola nut; English walnut; pecan       Signed,    Carol Ada, ND, MSOM, LAc  10/15/2015    [facility]  Naturopathic Doctor and Acupuncturist  Supervising physician: Dr. Sophronia Simas (NPI # [id number])  [phone] [id number]

## 2015-10-15 NOTE — Patient Instructions (Addendum)
RECOMMENDATIONS    DIET   Food challenge wheat, green pea, black tea, and clam.   With any reaction can take 1/4 tsp of baking soda mixed in warm water and drink this hourly after up to 3 to 4 times.   Strict FODMAP diet, with additional egg, pea/ bean, salmon, and calm avoidance    Rotate other foods from FIT in your diet   Bfast: eggs/ sausage; berries/ low FODMAP fruit; low FODMAP cooked veggies or soup   Lunch: low FODMAP soup + salad   Dinner: lean protein/ fish + low FODMAP veggies   Snack: low FODMAP nuts/ fruit/ veggies    SUPPLEMENTS   Tegrecel Colostrum by [employer] (immune support): 1 to 2 caps taken once to three times daily on empty stomach (15 minutes prior to meals or two hours after).   Twice Daily Multi by [employer] (overall support): take 1 cap 2x per day with meals.   Restore ([employer] or online) (heal gut lining/ restore gut ecology): 1 tsp to take before or with meals.   Vitamin C (adrenal and immune support): 500mg  2x per day.   N-Acetylcysteine by [employer] (hormonal and mood support): 900mg  taken twice daily.    MINDFULNESS:   HeadSpace meditation 10 minutes per day   Consider Transcendental Meditation    EXERCISE:   Keep up the great work    OTHER:   Consider asking Dr. Marcille Blanco for referral to [facility] for repeat skin prick testing and possible immunotherapy treatment (they teach you how to do the injections at home) other possible option might be sublingual immunotherapy drops that I can help with - but not insurance covered about $135 for 3 month supply.   Continue to follow with therapist and psychiatrist for mental health support.   Consider incorporating emotional freedom technique, will send resources via MyChart message on this.   Please give me an update through the MyChart patient portal of your progress mid-March   Consider Comprehensive Digestive Stool Testing next visit

## 2015-10-23 NOTE — Progress Notes (Signed)
Quick Note:    Lab results are in the normal range other than elevated LDL/ bad cholesterol level, total cholesterol and triglycerides; recommend compliance with low fat diet.  ______

## 2015-10-29 ENCOUNTER — Encounter (INDEPENDENT_AMBULATORY_CARE_PROVIDER_SITE_OTHER): Payer: Self-pay | Admitting: Acupuncturist

## 2015-11-19 ENCOUNTER — Other Ambulatory Visit (INDEPENDENT_AMBULATORY_CARE_PROVIDER_SITE_OTHER): Payer: Self-pay

## 2015-11-19 MED ORDER — METFORMIN HCL ER 500 MG PO TB24
500.0000 mg | ORAL_TABLET | Freq: Every day | ORAL | Status: DC
Start: 2015-11-19 — End: 2015-12-19

## 2015-11-19 NOTE — Telephone Encounter (Signed)
lv 10/02/15  Smith Robert pt

## 2015-11-30 ENCOUNTER — Other Ambulatory Visit (INDEPENDENT_AMBULATORY_CARE_PROVIDER_SITE_OTHER): Payer: Self-pay | Admitting: Internal Medicine

## 2015-12-16 ENCOUNTER — Ambulatory Visit (INDEPENDENT_AMBULATORY_CARE_PROVIDER_SITE_OTHER): Payer: PRIVATE HEALTH INSURANCE | Admitting: Acupuncturist

## 2015-12-19 ENCOUNTER — Other Ambulatory Visit (INDEPENDENT_AMBULATORY_CARE_PROVIDER_SITE_OTHER): Payer: Self-pay

## 2015-12-19 MED ORDER — JUNEL 1/20 1-20 MG-MCG PO TABS
1.0000 | ORAL_TABLET | Freq: Every day | ORAL | Status: DC
Start: 2015-12-19 — End: 2016-04-15

## 2015-12-19 MED ORDER — METFORMIN HCL ER 500 MG PO TB24
500.0000 mg | ORAL_TABLET | Freq: Every day | ORAL | Status: DC
Start: 2015-12-19 — End: 2016-06-29

## 2015-12-19 NOTE — Telephone Encounter (Signed)
lv 10/02/15

## 2016-02-04 ENCOUNTER — Ambulatory Visit (INDEPENDENT_AMBULATORY_CARE_PROVIDER_SITE_OTHER): Payer: PRIVATE HEALTH INSURANCE | Admitting: Family Medicine

## 2016-02-04 ENCOUNTER — Encounter (INDEPENDENT_AMBULATORY_CARE_PROVIDER_SITE_OTHER): Payer: Self-pay | Admitting: Family Medicine

## 2016-02-04 VITALS — BP 135/84 | HR 120 | Temp 98.1°F | Resp 18 | Wt 254.6 lb

## 2016-02-04 DIAGNOSIS — G43009 Migraine without aura, not intractable, without status migrainosus: Secondary | ICD-10-CM

## 2016-02-04 MED ORDER — BUTALBITAL-APAP-CAFFEINE 50-325-40 MG PO TABS
1.0000 | ORAL_TABLET | Freq: Four times a day (QID) | ORAL | Status: DC | PRN
Start: 2016-02-04 — End: 2016-03-09

## 2016-02-04 MED ORDER — ONDANSETRON 4 MG PO TBDP
4.0000 mg | ORAL_TABLET | Freq: Three times a day (TID) | ORAL | Status: DC | PRN
Start: 2016-02-04 — End: 2016-03-09

## 2016-02-04 NOTE — Progress Notes (Signed)
Subjective:      Date: 02/04/2016 3:06 PM   Patient ID: Irys Nigh is a 25 y.o. female.    Chief Complaint:  Chief Complaint   Patient presents with   . Headache     x 2 weeks    . Fatigue     x 2 weeks    . Nausea     x 2 weeks        HPI:  Headache   This is a new problem. Episode onset: x 2 weeks. The problem occurs intermittently. The problem has been gradually worsening. The pain is located in the frontal region. The pain radiates to the face. The quality of the pain is described as aching. The pain is at a severity of 4/10. The pain is moderate. Associated symptoms include abdominal pain, facial sweating and nausea. The symptoms are aggravated by noise and bright light. She has tried acetaminophen for the symptoms. The treatment provided mild relief.   Fatigue  Associated symptoms include abdominal pain, headaches and nausea.       Problem List:  Patient Active Problem List   Diagnosis   . PCOS (polycystic ovarian syndrome)   . Morbid obesity with BMI of 40.0-44.9, adult   . Small intestinal bacterial overgrowth   . Anxiety   . Moderate episode of recurrent major depressive disorder   . Irritable bowel syndrome with both constipation and diarrhea   . Mixed hyperlipidemia   . Idiopathic urticaria   . Seasonal allergic rhinitis due to pollen       Current Medications:  Current Outpatient Prescriptions   Medication Sig Dispense Refill   . ALPRAZolam (XANAX XR) 0.5 MG 24 hr tablet Take 0.5 mg by mouth every morning.     Marland Kitchen atomoxetine (STRATTERA) 60 MG capsule Take 60 mg by mouth daily.     Marland Kitchen BLISOVI FE 1/20 1-20 MG-MCG per tablet TAKE 1 TABLET BY MOUTH DAILY. 28 tablet 0   . buPROPion XL (WELLBUTRIN XL) 300 MG 24 hr tablet Take by mouth.     . cetirizine (ZYRTEC) 10 MG tablet Take 10 mg by mouth 2 (two) times daily.     . clobetasol (TEMOVATE) 0.05 % cream Apply 1 application topically 2 (two) times daily.     . famotidine (PEPCID) 20 MG tablet Take 20 mg by mouth every 12 (twelve) hours.     . fluticasone  (FLONASE) 50 MCG/ACT nasal spray 1 spray by Nasal route daily.     . metFORMIN (GLUCOPHAGE-XR) 500 MG 24 hr tablet Take 1 tablet (500 mg total) by mouth daily with dinner. 90 tablet 1   . NON FORMULARY        No current facility-administered medications for this visit.       Allergies:  Allergies   Allergen Reactions   . Bee Pollen    . Ceclor [Cefaclor] Hives   . Fruit & Vegetable Daily [Fish Oil]        Past Medical History:  Past Medical History   Diagnosis Date   . Heartburn    . Anxiety    . ADHD (attention deficit hyperactivity disorder)    . Eczema    . Tubular adenoma    . Small intestinal bacterial overgrowth    . Fatigue    . PCOS (polycystic ovarian syndrome)    . Kidney stones        Past Surgical History:  Past Surgical History   Procedure Laterality Date   . Kidney  stone surgery     . Wisdome teeth         Family History:  Family History   Problem Relation Age of Onset   . Diabetes Mother    . Hypertension Mother    . Cancer Paternal Aunt    . Stroke Paternal Grandfather        Social History:  Social History     Social History   . Marital Status: Single     Spouse Name: N/A   . Number of Children: N/A   . Years of Education: N/A     Occupational History   . Not on file.     Social History Main Topics   . Smoking status: Never Smoker    . Smokeless tobacco: Never Used   . Alcohol Use: No   . Drug Use: No   . Sexual Activity: No     Other Topics Concern   . Not on file     Social History Narrative       The following portions of the patient's history were reviewed and updated as appropriate: allergies, current medications, past family history, past medical history, past social history, past surgical history and problem list.      ROS:  General/Constitutional:   Denies Chills. Admits Fatigue. Denies Fever.   Ophthalmologic:   Denies Blurred vision.   ENT:   Denies Nasal Discharge. Denies Sinus pain. Denies Sore throat.   Respiratory:   Denies Cough. Denies Shortness of breath. Denies Wheezing.    Cardiovascular:   Denies Chest pain. Denies Chest pain with exertion. Denies Palpitations.   Gastrointestinal:   Admits Abdominal pain. Denies Constipation. Denies Diarrhea. Admits Nausea. Denies Vomiting.   Skin:   Denies Rash.   Neurologic:   Denies Dizziness. Admits Headache. Denies Tingling/Numbness.       Objective:     BP 135/84 mmHg  Pulse 120  Temp(Src) 98.1 F (36.7 C) (Oral)  Resp 18  Wt 115.486 kg (254 lb 9.6 oz)  SpO2 97%  LMP 01/21/2016 (Approximate)    Physical Exam:  General Examination:   GENERAL APPEARANCE: alert, in no acute distress, well developed, well nourished, oriented to time, place, and person.   ORAL CAVITY: normal oropharynx, normal lips, mucosa moist.   THROAT: normal appearance, clear.   NECK/THYROID: neck supple, no carotid bruit, carotid pulse 2+ bilaterally, no cervical lymphadenopathy, no neck mass palpated, no jugular venous distention, no  thyromegaly.   HEART: S1, S2 normal, no murmurs, rubs, gallops, regular rate and rhythm.   LUNGS: normal effort / no distress, normal breath sounds, clear to auscultation bilaterally, no wheezes, rales, rhonchi.   ABDOMEN: mildly tender to palpation diffusely, normoactive bowel sounds, no rebound or guarding.  EXTREMITIES: no clubbing, cyanosis, or edema bilaterally.   NEUROLOGIC: alert and oriented.      Assessment/Plan:       1. Migraine without aura and without status migrainosus, not intractable  - ondansetron (ZOFRAN ODT) 4 MG disintegrating tablet; Take 1 tablet (4 mg total) by mouth every 8 (eight) hours as needed for Nausea.  Dispense: 30 tablet; Refill: 0  - butalbital-acetaminophen-caffeine (FIORICET) 50-325-40 MG per tablet; Take 1-2 tablets by mouth every 6 (six) hours as needed for Headaches.  Dispense: 20 tablet; Refill: 0  Acute, worsening. Possibly related to stress at work. Will treat symptomatically with Zofran and Fioricet. Recommend patient not drive after taking these medications. Recommend increased water  consumption, avoid skipping meals, watchful waiting.  Chancy Milroy, MD

## 2016-03-09 ENCOUNTER — Encounter (INDEPENDENT_AMBULATORY_CARE_PROVIDER_SITE_OTHER): Payer: Self-pay | Admitting: Internal Medicine

## 2016-03-09 ENCOUNTER — Other Ambulatory Visit: Payer: PRIVATE HEALTH INSURANCE

## 2016-03-09 ENCOUNTER — Ambulatory Visit (INDEPENDENT_AMBULATORY_CARE_PROVIDER_SITE_OTHER): Payer: PRIVATE HEALTH INSURANCE | Admitting: Internal Medicine

## 2016-03-09 VITALS — BP 136/91 | HR 120 | Temp 98.4°F | Resp 16 | Ht 63.0 in | Wt 258.0 lb

## 2016-03-09 DIAGNOSIS — Z6841 Body Mass Index (BMI) 40.0 and over, adult: Secondary | ICD-10-CM

## 2016-03-09 DIAGNOSIS — E782 Mixed hyperlipidemia: Secondary | ICD-10-CM

## 2016-03-09 DIAGNOSIS — E282 Polycystic ovarian syndrome: Secondary | ICD-10-CM

## 2016-03-09 NOTE — Progress Notes (Signed)
Subjective:      Date: 03/09/2016 1:23 PM   Patient ID: Audrey Franklin is a 25 y.o. female.    Chief Complaint:  Chief Complaint   Patient presents with   . PCOS (polycystic ovarian syndrome)       HPI     Patient is here for PCOS . LMP: 02/18/2016 . Patient complains of this last month June, having every other week menstrual periods.Patient had an appointment at [facility] in September for SIBO. Has been treated with multiple rounds of antibiotics and has not noticed any symptoms relief.     Taking junel and reports regular menses on the birth control. Had an unexpected menses after missing a few doses of birth control. Reports interval improvement of acne. No interval worsening of hirsutism and alopecia.    Seen by GI- Dr. Allena Katz for chronic diarrhea. Weight gain of over 30 lbs within the last 1 year. PGM has h/o thyroid dysfunction. Audrey Franklin is a known lesbian and reports good family support.  H/O heavy and irregular menses, improved on birth control.     Reports ongoing episodes of hives. Reports ongoing c/o heavy menses. Has diarrhea once in a while; frequency has overall decreased.    Problem List:  Patient Active Problem List   Diagnosis   . PCOS (polycystic ovarian syndrome)   . Morbid obesity with BMI of 40.0-44.9, adult   . Small intestinal bacterial overgrowth   . Anxiety   . Moderate episode of recurrent major depressive disorder   . Irritable bowel syndrome with both constipation and diarrhea   . Mixed hyperlipidemia   . Idiopathic urticaria   . Seasonal allergic rhinitis due to pollen       Current Medications:  Current Outpatient Prescriptions   Medication Sig Dispense Refill   . ALPRAZolam (XANAX XR) 0.5 MG 24 hr tablet Take 0.5 mg by mouth every morning.     Marland Kitchen atomoxetine (STRATTERA) 60 MG capsule Take 60 mg by mouth daily.     Marland Kitchen BLISOVI FE 1/20 1-20 MG-MCG per tablet TAKE 1 TABLET BY MOUTH DAILY. 28 tablet 0   . buPROPion XL (WELLBUTRIN XL) 300 MG 24 hr tablet Take by mouth.     . cetirizine (ZYRTEC)  10 MG tablet Take 10 mg by mouth 2 (two) times daily.     . clobetasol (TEMOVATE) 0.05 % cream Apply 1 application topically 2 (two) times daily.     . famotidine (PEPCID) 20 MG tablet Take 20 mg by mouth every 12 (twelve) hours.     . fluticasone (FLONASE) 50 MCG/ACT nasal spray 1 spray by Nasal route daily.     . metFORMIN (GLUCOPHAGE-XR) 500 MG 24 hr tablet Take 1 tablet (500 mg total) by mouth daily with dinner. 90 tablet 1     No current facility-administered medications for this visit.       Allergies:  Allergies   Allergen Reactions   . Bee Pollen    . Ceclor [Cefaclor] Hives   . Fruit & Vegetable Daily [Fish Oil]        Past Medical History:  Past Medical History   Diagnosis Date   . Heartburn    . Anxiety    . ADHD (attention deficit hyperactivity disorder)    . Eczema    . Tubular adenoma    . Small intestinal bacterial overgrowth    . Fatigue    . PCOS (polycystic ovarian syndrome)    . Kidney stones  Past Surgical History:  Past Surgical History   Procedure Laterality Date   . Kidney stone surgery     . Wisdome teeth         Family History:  Family History   Problem Relation Age of Onset   . Diabetes Mother    . Hypertension Mother    . Cancer Paternal Aunt    . Stroke Paternal Grandfather        Social History:  Social History     Social History   . Marital Status: Single     Spouse Name: N/A   . Number of Children: N/A   . Years of Education: N/A     Occupational History   . Not on file.     Social History Main Topics   . Smoking status: Never Smoker    . Smokeless tobacco: Never Used   . Alcohol Use: No   . Drug Use: No   . Sexual Activity: No     Other Topics Concern   . Not on file     Social History Narrative       The following portions of the patient's history were reviewed and updated as appropriate: allergies, current medications, past family history, past medical history, past social history, past surgical history and problem list.    ROS: A complete 12 point ROS was obtained. Pertinent  positives and negatives as noted in HPI. All other systems are negative.    Vitals:  BP 136/91 mmHg  Pulse 120  Temp(Src) 98.4 F (36.9 C)  Resp 16  Ht 1.6 m (5\' 3" )  Wt 117.028 kg (258 lb)  BMI 45.71 kg/m2  SpO2 98%  LMP 01/19/2016   Wt Readings from Last 3 Encounters:   03/09/16 117.028 kg (258 lb)   02/04/16 115.486 kg (254 lb 9.6 oz)   10/02/15 114.579 kg (252 lb 9.6 oz)     Body mass index is 45.71 kg/(m^2).      Physical Exam   Constitutional: She is oriented to person, place, and time. She appears well-developed and well-nourished.   Head: Normocephalic and atraumatic.   Mouth/Throat: Oropharynx is clear and moist.   Eyes: Conjunctivae and EOM are normal. Pupils are equal, round, and reactive to light.   Neck: Normal range of motion. Neck supple. No thyromegaly present.   Cardiovascular: Normal rate, regular rhythm, normal heart sounds and intact distal pulses.    Pulmonary/Chest: Effort normal and breath sounds normal.   Abdominal: Soft. Bowel sounds are normal. She exhibits no distension. There is no tenderness.   Musculoskeletal: Normal range of motion. She exhibits no edema or tenderness.   No tremors on outstretched arms.   Lymphadenopathy:   She has no cervical adenopathy.   Neurological: She is alert and oriented to person, place, and time. She has normal reflexes. No cranial nerve deficit.   Skin: Skin is warm and dry.  No hyperpigmentation, violaceous striae or acanthosis nigricans. + facial acne  Psychiatric: She has a normal mood and affect.     ASSESSMENT AND PLAN    1. PCOS (polycystic ovarian syndrome)  Menses regular on OCPs, interval improvement in acne, hirsutism and alopecia. Discussed that we could do a trial of low dose metformin and assess clinical response; given that her symptoms of diarrhea are stable. Report interval improvement in symptoms of diarrhea and low dose metformin has been well tolerated.    - Comprehensive metabolic panel  - TSH  - T4, free  -  Hemoglobin A1C  -  DHEA-sulfate  - Testosterone    2. Morbid obesity with BMI of 40.0-44.9, adult  Recommend calorie restricted diet and exercise for weight loss.    3. Body mass index 40.0-44.9, adult    4. Hyperlipidemia  Reports inconsistent diet; discussed that birth control pills could be related to higher cholesterol levels.  Lab Results   Component Value Date    CHOL 257* 10/02/2015    CHOL 276* 07/19/2015     Lab Results   Component Value Date    HDL 59 10/02/2015     Lab Results   Component Value Date    LDL 146* 10/02/2015     Lab Results   Component Value Date    TRIG 261* 10/02/2015    TRIG 259* 07/19/2015       RTC in 6 months    Tanya Nones MD MPH  Endocrinologist, [facility]

## 2016-03-10 LAB — COMPREHENSIVE METABOLIC PANEL
ALT: 10 IU/L (ref 0–32)
AST (SGOT): 13 IU/L (ref 0–40)
Albumin/Globulin Ratio: 1.7 (ref 1.2–2.2)
Albumin: 4.3 g/dL (ref 3.5–5.5)
Alkaline Phosphatase: 90 IU/L (ref 39–117)
BUN / Creatinine Ratio: 14 (ref 9–23)
BUN: 10 mg/dL (ref 6–20)
Bilirubin, Total: 0.4 mg/dL (ref 0.0–1.2)
CO2: 22 mmol/L (ref 18–29)
Calcium: 9.2 mg/dL (ref 8.7–10.2)
Chloride: 101 mmol/L (ref 96–106)
Creatinine: 0.74 mg/dL (ref 0.57–1.00)
EGFR: 114 mL/min/{1.73_m2} (ref >59)
EGFR: 131 mL/min/{1.73_m2} (ref >59)
Globulin, Total: 2.6 g/dL (ref 1.5–4.5)
Glucose: 93 mg/dL (ref 65–99)
Potassium: 4.1 mmol/L (ref 3.5–5.2)
Protein, Total: 6.9 g/dL (ref 6.0–8.5)
Sodium: 140 mmol/L (ref 134–144)

## 2016-03-10 LAB — TSH: TSH: 2.04 u[IU]/mL (ref 0.450–4.500)

## 2016-03-10 LAB — TESTOSTERONE: Testosterone: 32 ng/dL (ref 8–48)

## 2016-03-10 LAB — HEMOGLOBIN A1C: Hemoglobin A1C: 4.9 % (ref 4.8–5.6)

## 2016-03-10 LAB — T4, FREE: T4, Free: 1.12 ng/dL (ref 0.82–1.77)

## 2016-03-10 LAB — DHEA-SULFATE: DHEA-SO4: 155.9 ug/dL (ref 110.0–431.7)

## 2016-03-15 NOTE — Progress Notes (Signed)
Quick Note:    Lab results are in the normal range. Significant interval improvement in A1c/ 3 month estimate of avg sugars.  ______

## 2016-04-15 ENCOUNTER — Other Ambulatory Visit (INDEPENDENT_AMBULATORY_CARE_PROVIDER_SITE_OTHER): Payer: Self-pay | Admitting: Internal Medicine

## 2016-05-10 ENCOUNTER — Other Ambulatory Visit (INDEPENDENT_AMBULATORY_CARE_PROVIDER_SITE_OTHER): Payer: Self-pay | Admitting: "Endocrinology

## 2016-05-11 NOTE — Telephone Encounter (Signed)
Fwd Rx refill

## 2016-05-13 ENCOUNTER — Other Ambulatory Visit (INDEPENDENT_AMBULATORY_CARE_PROVIDER_SITE_OTHER): Payer: Self-pay | Admitting: "Endocrinology

## 2016-05-13 NOTE — Telephone Encounter (Signed)
rx request 

## 2016-06-01 ENCOUNTER — Ambulatory Visit (INDEPENDENT_AMBULATORY_CARE_PROVIDER_SITE_OTHER): Payer: PRIVATE HEALTH INSURANCE | Admitting: Family Medicine

## 2016-06-01 ENCOUNTER — Encounter (INDEPENDENT_AMBULATORY_CARE_PROVIDER_SITE_OTHER): Payer: Self-pay | Admitting: Family Medicine

## 2016-06-01 VITALS — BP 129/85 | HR 117 | Temp 98.0°F | Resp 16 | Ht 63.0 in | Wt 260.0 lb

## 2016-06-01 DIAGNOSIS — R002 Palpitations: Secondary | ICD-10-CM

## 2016-06-01 NOTE — Progress Notes (Signed)
Subjective:      Date: 06/01/2016 4:03 PM   Patient ID: Audrey Franklin is a 25 y.o. female.    Chief Complaint:  Chief Complaint   Patient presents with   . Palpitations       HPI:  Palpitations    This is a chronic problem. Episode onset: more than 3-5 years has gotten worse. The problem has been gradually worsening. Nothing (time of day) aggravates the symptoms. Associated symptoms include anxiety, chest pain and an irregular heartbeat. Pertinent negatives include no dizziness, nausea, shortness of breath or syncope.       Problem List:  Patient Active Problem List   Diagnosis   . PCOS (polycystic ovarian syndrome)   . Morbid obesity with BMI of 40.0-44.9, adult   . Small intestinal bacterial overgrowth   . Anxiety   . Moderate episode of recurrent major depressive disorder   . Irritable bowel syndrome with both constipation and diarrhea   . Mixed hyperlipidemia   . Idiopathic urticaria   . Seasonal allergic rhinitis due to pollen       Current Medications:  Current Outpatient Prescriptions   Medication Sig Dispense Refill   . ALPRAZolam (XANAX XR) 0.5 MG 24 hr tablet Take 0.5 mg by mouth every morning.     Marland Kitchen atomoxetine (STRATTERA) 60 MG capsule Take 60 mg by mouth daily.     Marland Kitchen BLISOVI FE 1/20 1-20 MG-MCG per tablet TAKE 1 TABLET BY MOUTH DAILY. 28 tablet 0   . buPROPion XL (WELLBUTRIN XL) 300 MG 24 hr tablet Take by mouth.     Marland Kitchen buPROPion XL (WELLBUTRIN XL) 300 MG 24 hr tablet Take by mouth daily.         . cetirizine (ZYRTEC) 10 MG tablet Take 10 mg by mouth 2 (two) times daily.     . famotidine (PEPCID) 20 MG tablet Take 20 mg by mouth every 12 (twelve) hours.     . fluticasone (FLONASE) 50 MCG/ACT nasal spray 1 spray by Nasal route daily.     . metFORMIN (GLUCOPHAGE-XR) 500 MG 24 hr tablet Take 1 tablet (500 mg total) by mouth daily with dinner. 90 tablet 1   . clobetasol (TEMOVATE) 0.05 % cream Apply 1 application topically 2 (two) times daily.     . Vilazodone HCl 20 MG Tab Take by mouth daily.           No  current facility-administered medications for this visit.        Allergies:  Allergies   Allergen Reactions   . Bee Pollen    . Ceclor [Cefaclor] Hives   . Fruit & Vegetable Daily [Fish Oil]        Past Medical History:  Past Medical History:   Diagnosis Date   . ADHD (attention deficit hyperactivity disorder)    . Anxiety    . Eczema    . Fatigue    . Heartburn    . Kidney stones    . PCOS (polycystic ovarian syndrome)    . Small intestinal bacterial overgrowth    . Tubular adenoma        Past Surgical History:  Past Surgical History:   Procedure Laterality Date   . KIDNEY STONE SURGERY     . wisdome teeth         Family History:  Family History   Problem Relation Age of Onset   . Diabetes Mother    . Hypertension Mother    . Cancer Paternal Aunt    .  Stroke Paternal Grandfather        Social History:  Social History     Social History   . Marital status: Single     Spouse name: N/A   . Number of children: N/A   . Years of education: N/A     Occupational History   . Not on file.     Social History Main Topics   . Smoking status: Never Smoker   . Smokeless tobacco: Never Used   . Alcohol use No   . Drug use: No   . Sexual activity: No     Other Topics Concern   . Not on file     Social History Narrative   . No narrative on file       The following sections were reviewed this encounter by the provider:          ROS:  General/Constitutional:   Denies Chills. Denies Fatigue. Denies Fever.   Respiratory:   Denies Cough. Denies Shortness of breath. Denies Wheezing.   Cardiovascular:   Admits chronic Chest pain. Denies Chest pain with exertion. Admits Palpitations.   Gastrointestinal:   Denies Abdominal pain. Denies Constipation. Denies Diarrhea. Denies Nausea. Denies Vomiting.   Skin:   Denies Rash.   Neurologic:   Denies Dizziness. Denies Headache. Denies Tingling/Numbness.       Objective:   BP 129/85 (BP Site: Left arm, Patient Position: Sitting, Cuff Size: Large)   Pulse (!) 117   Temp 98 F (36.7 C) (Oral)    Resp 16   Ht 1.6 m (5\' 3" )   Wt 117.9 kg (260 lb)   SpO2 97%   BMI 46.06 kg/m       Physical Exam:  General Examination:   GENERAL APPEARANCE: alert, in no acute distress, well developed, well nourished, oriented to time, place, and person.   ORAL CAVITY: mucosa moist.   HEART: S1, S2 normal, no murmurs, rubs, gallops, regular rate and rhythm.   LUNGS: normal effort / no distress, normal breath sounds, clear to auscultation bilaterally, no wheezes, rales, rhonchi.   EXTREMITIES: no clubbing, cyanosis, or edema bilaterally.   NEUROLOGIC: alert and oriented.     EKG: sinus rhythm with tachycardia, no acute ST-T changes.    Assessment/Plan:       1. Palpitation  - CBC and differential  - ECG 12 lead  Persistent, stable. Differential diagnosis of syncope includes anxiety, anemia, drug-induced, or cardiac/neurogenic etiology. Recommend watchful waiting; follow up at ED for acute or worsening symptoms. Follow up with psychiatrist to discuss medication adjustment.        Carney Bern, MD

## 2016-06-02 LAB — CBC AND DIFFERENTIAL
Baso(Absolute): 0.1 10*3/uL (ref 0.0–0.2)
Basos: 0 %
Eos: 2 %
Eosinophils Absolute: 0.2 10*3/uL (ref 0.0–0.4)
Hematocrit: 41.6 % (ref 34.0–46.6)
Hemoglobin: 13.8 g/dL (ref 11.1–15.9)
Immature Granulocytes Absolute: 0 10*3/uL (ref 0.0–0.1)
Immature Granulocytes: 0 %
Lymphocytes Absolute: 1.9 10*3/uL (ref 0.7–3.1)
Lymphocytes: 17 %
MCH: 27.8 pg (ref 26.6–33.0)
MCHC: 33.2 g/dL (ref 31.5–35.7)
MCV: 84 fL (ref 79–97)
Monocytes Absolute: 0.5 10*3/uL (ref 0.1–0.9)
Monocytes: 4 %
Neutrophils Absolute: 8.6 10*3/uL — ABNORMAL HIGH (ref 1.4–7.0)
Neutrophils: 77 %
Platelets: 319 10*3/uL (ref 150–379)
RBC: 4.97 x10E6/uL (ref 3.77–5.28)
RDW: 14.2 % (ref 12.3–15.4)
WBC: 11.2 10*3/uL — ABNORMAL HIGH (ref 3.4–10.8)

## 2016-06-10 ENCOUNTER — Other Ambulatory Visit (INDEPENDENT_AMBULATORY_CARE_PROVIDER_SITE_OTHER): Payer: Self-pay | Admitting: Internal Medicine

## 2016-06-22 ENCOUNTER — Encounter (INDEPENDENT_AMBULATORY_CARE_PROVIDER_SITE_OTHER): Payer: Self-pay | Admitting: Internal Medicine

## 2016-06-29 ENCOUNTER — Ambulatory Visit (INDEPENDENT_AMBULATORY_CARE_PROVIDER_SITE_OTHER): Payer: PRIVATE HEALTH INSURANCE | Admitting: Internal Medicine

## 2016-06-29 ENCOUNTER — Encounter (INDEPENDENT_AMBULATORY_CARE_PROVIDER_SITE_OTHER): Payer: Self-pay | Admitting: Internal Medicine

## 2016-06-29 VITALS — BP 124/85 | HR 108 | Temp 97.8°F | Resp 16 | Ht 63.0 in | Wt 264.0 lb

## 2016-06-29 DIAGNOSIS — Z6841 Body Mass Index (BMI) 40.0 and over, adult: Secondary | ICD-10-CM

## 2016-06-29 DIAGNOSIS — E282 Polycystic ovarian syndrome: Secondary | ICD-10-CM

## 2016-06-29 DIAGNOSIS — E782 Mixed hyperlipidemia: Secondary | ICD-10-CM

## 2016-06-29 MED ORDER — METFORMIN HCL ER 500 MG PO TB24
1000.0000 mg | ORAL_TABLET | Freq: Every day | ORAL | 1 refills | Status: DC
Start: 2016-06-29 — End: 2017-01-05

## 2016-06-29 NOTE — Progress Notes (Signed)
Subjective:      Date: 06/29/2016 8:13 AM   Patient ID: Audrey Franklin is a 25 y.o. female.    Chief Complaint:  Chief Complaint   Patient presents with   . PCOS (polycystic ovarian syndrome)       HPI     Patient is here for PCOS . LMP: 02/18/2016 . Patient complains of this last month June, having every other week menstrual periods.Patient had an appointment at [facility] in September for SIBO. Has been treated with multiple rounds of antibiotics and has not noticed any symptoms relief.     Taking junel and reports regular menses on the birth control. Had an unexpected menses after missing a few doses of birth control. Reports interval improvement of acne. No interval worsening of hirsutism and alopecia.    Seen by GI- Dr. Allena Katz for chronic diarrhea. Weight gain of over 30 lbs within the last 1 year. PGM has h/o thyroid dysfunction. Ms. Rager is a known lesbian and reports good family support.  H/O heavy and irregular menses, improved on birth control.     Reports ongoing episodes of hives. Reports ongoing c/o heavy menses. Has diarrhea once in a while; frequency has overall decreased.  Had 2 periods last month. For the last 2 months, has had heavy and midcycle bleeding. Also notes sharp abdominal pain, which occasionally happens every 5-10 min. Has not been seen by Gyn.    Problem List:  Patient Active Problem List   Diagnosis   . PCOS (polycystic ovarian syndrome)   . Morbid obesity with BMI of 40.0-44.9, adult   . Small intestinal bacterial overgrowth   . Anxiety   . Moderate episode of recurrent major depressive disorder   . Irritable bowel syndrome with both constipation and diarrhea   . Mixed hyperlipidemia   . Idiopathic urticaria   . Seasonal allergic rhinitis due to pollen       Current Medications:  Current Outpatient Prescriptions   Medication Sig Dispense Refill   . ALPRAZolam (XANAX XR) 0.5 MG 24 hr tablet Take 0.5 mg by mouth every morning.     Marland Kitchen atomoxetine (STRATTERA) 60 MG capsule Take 60 mg by mouth  daily.     Marland Kitchen BLISOVI FE 1/20 1-20 MG-MCG per tablet TAKE 1 TABLET BY MOUTH DAILY. 28 tablet 6   . buPROPion XL (WELLBUTRIN XL) 150 MG 24 hr tablet      . cetirizine (ZYRTEC) 10 MG tablet Take 10 mg by mouth 2 (two) times daily.     . famotidine (PEPCID) 20 MG tablet Take 20 mg by mouth every 12 (twelve) hours.     . fluticasone (FLONASE) 50 MCG/ACT nasal spray 1 spray by Nasal route daily.     . metFORMIN (GLUCOPHAGE-XR) 500 MG 24 hr tablet Take 1 tablet (500 mg total) by mouth daily with dinner. 90 tablet 1     No current facility-administered medications for this visit.        Allergies:  Allergies   Allergen Reactions   . Bee Pollen    . Ceclor [Cefaclor] Hives   . Fruit & Vegetable Daily [Fish Oil]        Past Medical History:  Past Medical History:   Diagnosis Date   . ADHD (attention deficit hyperactivity disorder)    . Anxiety    . Eczema    . Fatigue    . Heartburn    . Kidney stones    . PCOS (polycystic ovarian syndrome)    .  Small intestinal bacterial overgrowth    . Tubular adenoma        Past Surgical History:  Past Surgical History:   Procedure Laterality Date   . KIDNEY STONE SURGERY     . wisdome teeth         Family History:  Family History   Problem Relation Age of Onset   . Diabetes Mother    . Hypertension Mother    . Cancer Paternal Aunt    . Stroke Paternal Grandfather        Social History:  Social History     Social History   . Marital status: Single     Spouse name: N/A   . Number of children: N/A   . Years of education: N/A     Occupational History   . Not on file.     Social History Main Topics   . Smoking status: Never Smoker   . Smokeless tobacco: Never Used   . Alcohol use No   . Drug use: No   . Sexual activity: No     Other Topics Concern   . Not on file     Social History Narrative   . No narrative on file       The following portions of the patient's history were reviewed and updated as appropriate: allergies, current medications, past family history, past medical history, past  social history, past surgical history and problem list.    ROS: A complete 12 point ROS was obtained. Pertinent positives and negatives as noted in HPI. All other systems are negative.    Vitals:  BP 124/85   Pulse (!) 108   Temp 97.8 F (36.6 C)   Resp 16   Ht 1.6 m (5\' 3" )   Wt 119.7 kg (264 lb)   LMP 06/22/2016   SpO2 98%   BMI 46.77 kg/m    Wt Readings from Last 3 Encounters:   06/29/16 119.7 kg (264 lb)   06/01/16 117.9 kg (260 lb)   03/09/16 117 kg (258 lb)     Body mass index is 46.77 kg/m.      Physical Exam   Constitutional: She is oriented to person, place, and time. She appears well-developed and well-nourished.   Head: Normocephalic and atraumatic.   Mouth/Throat: Oropharynx is clear and moist.   Eyes: Conjunctivae and EOM are normal. Pupils are equal, round, and reactive to light.   Neck: Normal range of motion. Neck supple. No thyromegaly present.   Cardiovascular: Normal rate, regular rhythm, normal heart sounds and intact distal pulses.    Pulmonary/Chest: Effort normal and breath sounds normal.   Abdominal: Soft. Bowel sounds are normal. She exhibits no distension. There is no tenderness.   Musculoskeletal: Normal range of motion. She exhibits no edema or tenderness.   No tremors on outstretched arms.   Lymphadenopathy:   She has no cervical adenopathy.   Neurological: She is alert and oriented to person, place, and time. She has normal reflexes. No cranial nerve deficit.   Skin: Skin is warm and dry.  No hyperpigmentation, violaceous striae or acanthosis nigricans. + facial acne  Psychiatric: She has a normal mood and affect.     ASSESSMENT AND PLAN    1. PCOS (polycystic ovarian syndrome)  Menses regular on OCPs, interval improvement in acne, hirsutism and alopecia. Discussed that we could do a trial of low dose metformin and assess clinical response; given that her symptoms of diarrhea are stable. Report interval improvement  in symptoms of diarrhea and low dose metformin has been well  tolerated.    - Comprehensive metabolic panel  - TSH  - T4, free  - Hemoglobin A1C  - DHEA-sulfate  - Testosterone    2. Morbid obesity with BMI of 40.0-44.9, adult  Recommend calorie restricted diet and exercise for weight loss.    3. Body mass index 40.0-44.9, adult    4. Hyperlipidemia  Reports inconsistent diet; discussed that birth control pills could be related to higher cholesterol levels.  Lab Results   Component Value Date    CHOL 257 (H) 10/02/2015    CHOL 276 (H) 07/19/2015     Lab Results   Component Value Date    HDL 59 10/02/2015     Lab Results   Component Value Date    LDL 146 (H) 10/02/2015     Lab Results   Component Value Date    TRIG 261 (H) 10/02/2015    TRIG 259 (H) 07/19/2015       RTC in 6 months    Tanya Nones MD MPH  Endocrinologist, [facility]

## 2016-07-07 ENCOUNTER — Other Ambulatory Visit (INDEPENDENT_AMBULATORY_CARE_PROVIDER_SITE_OTHER): Payer: Self-pay

## 2016-07-07 NOTE — Telephone Encounter (Signed)
Please approve or deny rx refill for Blisovi Fe. Karin Golden on file.

## 2016-07-12 MED ORDER — BLISOVI FE 1/20 1-20 MG-MCG PO TABS
ORAL_TABLET | ORAL | 6 refills | Status: DC
Start: 2016-07-12 — End: 2017-05-03

## 2016-07-21 ENCOUNTER — Encounter (INDEPENDENT_AMBULATORY_CARE_PROVIDER_SITE_OTHER): Payer: Self-pay | Admitting: Internal Medicine

## 2016-09-01 ENCOUNTER — Encounter (INDEPENDENT_AMBULATORY_CARE_PROVIDER_SITE_OTHER): Payer: Self-pay | Admitting: Family Medicine

## 2016-09-09 ENCOUNTER — Encounter (INDEPENDENT_AMBULATORY_CARE_PROVIDER_SITE_OTHER): Payer: Self-pay | Admitting: Internal Medicine

## 2016-09-09 ENCOUNTER — Other Ambulatory Visit (INDEPENDENT_AMBULATORY_CARE_PROVIDER_SITE_OTHER): Payer: Self-pay | Admitting: Internal Medicine

## 2016-09-09 ENCOUNTER — Encounter (INDEPENDENT_AMBULATORY_CARE_PROVIDER_SITE_OTHER): Payer: PRIVATE HEALTH INSURANCE | Admitting: Internal Medicine

## 2016-09-09 ENCOUNTER — Other Ambulatory Visit: Payer: PRIVATE HEALTH INSURANCE

## 2016-09-09 ENCOUNTER — Ambulatory Visit (INDEPENDENT_AMBULATORY_CARE_PROVIDER_SITE_OTHER): Payer: PRIVATE HEALTH INSURANCE | Admitting: Internal Medicine

## 2016-09-09 VITALS — BP 134/83 | HR 69 | Temp 98.0°F | Ht 63.0 in | Wt 273.6 lb

## 2016-09-09 DIAGNOSIS — E782 Mixed hyperlipidemia: Secondary | ICD-10-CM

## 2016-09-09 DIAGNOSIS — E282 Polycystic ovarian syndrome: Secondary | ICD-10-CM

## 2016-09-09 DIAGNOSIS — Z6841 Body Mass Index (BMI) 40.0 and over, adult: Secondary | ICD-10-CM

## 2016-09-09 NOTE — Progress Notes (Signed)
Subjective:      Date: 09/09/2016 3:06 PM   Patient ID: Audrey Franklin is a 26 y.o. female.    Chief Complaint:  Chief Complaint   Patient presents with   . Follow-up   . Polycystic Ovaries       HPI     Patient is seen for further management of PCOS . LMP: 08/14/16 . Patient having every other week menstrual periods.. Has been treated with multiple rounds of antibiotics and has not noticed any symptoms relief.     Taking junel and reports regular menses on the birth control. Had an unexpected menses after missing a few doses of birth control. Reports interval improvement of acne. No interval worsening of hirsutism and alopecia.    Seen by GI- Dr. Allena Katz for chronic diarrhea. Weight gain of over 30 lbs within the last 1 year. PGM has h/o thyroid dysfunction. Audrey Franklin is a known lesbian and reports good family support.  H/O heavy and irregular menses, improved on birth control.     Reports ongoing episodes of hives. Reports ongoing c/o heavy menses. Has diarrhea once in a while; frequency has overall decreased.  Had 2 periods last month. For the last 2 months, has had heavy and midcycle bleeding. Also notes sharp abdominal pain, which occasionally happens every 5-10 min. Has not been seen by Gyn. Recent significant weight gain is following change in her medications for mental illness.    Problem List:  Patient Active Problem List   Diagnosis   . PCOS (polycystic ovarian syndrome)   . Morbid obesity with BMI of 40.0-44.9, adult   . Small intestinal bacterial overgrowth   . Anxiety   . Moderate episode of recurrent major depressive disorder   . Irritable bowel syndrome with both constipation and diarrhea   . Mixed hyperlipidemia   . Idiopathic urticaria   . Seasonal allergic rhinitis due to pollen       Current Medications:  Current Outpatient Prescriptions   Medication Sig Dispense Refill   . ALPRAZolam (XANAX XR) 0.5 MG 24 hr tablet Take 0.5 mg by mouth every morning.     Marland Kitchen atomoxetine (STRATTERA) 60 MG capsule Take 60  mg by mouth daily.     Marland Kitchen BLISOVI FE 1/20 1-20 MG-MCG per tablet TAKE 1 TABLET BY MOUTH DAILY. 28 tablet 6   . buPROPion XL (WELLBUTRIN XL) 150 MG 24 hr tablet      . cetirizine (ZYRTEC) 10 MG tablet Take 10 mg by mouth 2 (two) times daily.     . famotidine (PEPCID) 20 MG tablet Take 20 mg by mouth every 12 (twelve) hours.     . fluticasone (FLONASE) 50 MCG/ACT nasal spray 1 spray by Nasal route daily.     . metFORMIN (GLUCOPHAGE-XR) 500 MG 24 hr tablet Take 2 tablets (1,000 mg total) by mouth daily with dinner. 180 tablet 1   . VIIBRYD 40 MG Tab        No current facility-administered medications for this visit.        Allergies:  Allergies   Allergen Reactions   . Bee Pollen    . Ceclor [Cefaclor] Hives   . Fruit & Vegetable Daily [Fish Oil]    . Other        Past Medical History:  Past Medical History:   Diagnosis Date   . ADHD (attention deficit hyperactivity disorder)    . Anxiety    . Eczema    . Fatigue    .  Heartburn    . Kidney stones    . PCOS (polycystic ovarian syndrome)    . Small intestinal bacterial overgrowth    . Tubular adenoma        Past Surgical History:  Past Surgical History:   Procedure Laterality Date   . KIDNEY STONE SURGERY     . wisdome teeth         Family History:  Family History   Problem Relation Age of Onset   . Diabetes Mother    . Hypertension Mother    . Cancer Paternal Aunt    . Stroke Paternal Grandfather        Social History:  Social History     Social History   . Marital status: Single     Spouse name: N/A   . Number of children: N/A   . Years of education: N/A     Occupational History   . Not on file.     Social History Main Topics   . Smoking status: Never Smoker   . Smokeless tobacco: Never Used   . Alcohol use No   . Drug use: No   . Sexual activity: No     Other Topics Concern   . Not on file     Social History Narrative   . No narrative on file       The following portions of the patient's history were reviewed and updated as appropriate: allergies, current medications,  past family history, past medical history, past social history, past surgical history and problem list.    ROS: A complete 12 point ROS was obtained. Pertinent positives and negatives as noted in HPI. All other systems are negative.    Vitals:  BP 134/83 (BP Site: Left arm, Patient Position: Sitting, Cuff Size: Large)   Pulse 69   Temp 98 F (36.7 C) (Oral)   Ht 1.6 m (5\' 3" )   Wt 124.1 kg (273 lb 9.6 oz)   BMI 48.47 kg/m    Wt Readings from Last 3 Encounters:   09/09/16 124.1 kg (273 lb 9.6 oz)   06/29/16 119.7 kg (264 lb)   06/01/16 117.9 kg (260 lb)     Body mass index is 48.47 kg/m.      Physical Exam   Constitutional: She is oriented to person, place, and time. She appears well-developed and well-nourished.   Head: Normocephalic and atraumatic.   Mouth/Throat: Oropharynx is clear and moist.   Eyes: Conjunctivae and EOM are normal. Pupils are equal, round, and reactive to light.   Neck: Normal range of motion. Neck supple. No thyromegaly present.   Cardiovascular: Normal rate, regular rhythm, normal heart sounds and intact distal pulses.    Pulmonary/Chest: Effort normal and breath sounds normal.   Abdominal: Soft. Bowel sounds are normal. She exhibits no distension. There is no tenderness.   Musculoskeletal: Normal range of motion. She exhibits no edema or tenderness.   No tremors on outstretched arms.   Lymphadenopathy: She has no cervical adenopathy.   Neurological: She is alert and oriented to person, place, and time. She has normal reflexes. No cranial nerve deficit.   Skin: Skin is warm and dry.  No hyperpigmentation, violaceous striae or acanthosis nigricans. + facial acne  Psychiatric: She has a normal mood and affect.     ASSESSMENT AND PLAN    1. PCOS (polycystic ovarian syndrome)  Menses regular on OCPs, interval improvement in acne, hirsutism and alopecia. Metformin has been well tolerated and no  episodes of worsening diarrhea at present.    - Comprehensive metabolic panel  - TSH  - T4,  free  - Hemoglobin A1C  - DHEA-sulfate  - Testosterone    2. Morbid obesity with BMI of 40.0-44.9, adult  Recommend calorie restricted diet and exercise for weight loss. Recent weight gain following change in her medications.    3. Body mass index 40.0-44.9, adult    4. Hyperlipidemia  Reports inconsistent diet; discussed that birth control pills could be related to higher cholesterol levels.  Lab Results   Component Value Date    CHOL 257 (H) 10/02/2015    CHOL 276 (H) 07/19/2015     Lab Results   Component Value Date    HDL 59 10/02/2015     Lab Results   Component Value Date    LDL 146 (H) 10/02/2015     Lab Results   Component Value Date    TRIG 261 (H) 10/02/2015    TRIG 259 (H) 07/19/2015       RTC in 3 months    Tanya Nones MD MPH  Endocrinologist, [facility]

## 2016-09-10 LAB — COMPREHENSIVE METABOLIC PANEL
ALT: 11 IU/L (ref 0–32)
AST (SGOT): 13 IU/L (ref 0–40)
Albumin/Globulin Ratio: 1.9 (ref 1.2–2.2)
Albumin: 4.4 g/dL (ref 3.5–5.5)
Alkaline Phosphatase: 91 IU/L (ref 39–117)
BUN / Creatinine Ratio: 12 (ref 9–23)
BUN: 9 mg/dL (ref 6–20)
Bilirubin, Total: 0.5 mg/dL (ref 0.0–1.2)
CO2: 24 mmol/L (ref 18–29)
Calcium: 8.6 mg/dL — ABNORMAL LOW (ref 8.7–10.2)
Chloride: 100 mmol/L (ref 96–106)
Creatinine: 0.75 mg/dL (ref 0.57–1.00)
EGFR: 112 mL/min/{1.73_m2} (ref >59)
EGFR: 129 mL/min/{1.73_m2} (ref >59)
Globulin, Total: 2.3 g/dL (ref 1.5–4.5)
Glucose: 80 mg/dL (ref 65–99)
Potassium: 4.3 mmol/L (ref 3.5–5.2)
Protein, Total: 6.7 g/dL (ref 6.0–8.5)
Sodium: 138 mmol/L (ref 134–144)

## 2016-09-10 LAB — TESTOSTERONE: Testosterone: 26 ng/dL (ref 8–48)

## 2016-09-10 LAB — LIPID PANEL
Cholesterol / HDL Ratio: 4.5 ratio units — ABNORMAL HIGH (ref 0.0–4.4)
Cholesterol: 234 mg/dL — ABNORMAL HIGH (ref 100–199)
HDL: 52 mg/dL (ref >39)
LDL Calculated: 118 mg/dL — ABNORMAL HIGH (ref 0–99)
Triglycerides: 319 mg/dL — ABNORMAL HIGH (ref 0–149)
VLDL Calculated: 64 mg/dL — ABNORMAL HIGH (ref 5–40)

## 2016-09-10 LAB — T4, FREE: T4, Free: 1.04 ng/dL (ref 0.82–1.77)

## 2016-09-10 LAB — DHEA-SULFATE: DHEA-SO4: 150.8 ug/dL (ref 110.0–431.7)

## 2016-09-10 LAB — TSH: TSH: 1.76 u[IU]/mL (ref 0.450–4.500)

## 2016-09-10 LAB — HEMOGLOBIN A1C: Hemoglobin A1C: 5 % (ref 4.8–5.6)

## 2016-09-12 NOTE — Progress Notes (Signed)
Lab results are in the normal range other than the following:  1. Cholesterol level shows improved LDL/ bad cholesterol, elevated total cholesterol and triglycerides. Recommend consistent compliance with diet low in saturated fat like whole fat dairy, fried food, red meat etc. In addition, recommend omega 3 fatty acids/ fish oil supplements 2 capsules with food twice daily.  2. Recommend addition of calcium supplement 1 tab daily with food due to low calcium level.

## 2016-09-14 ENCOUNTER — Encounter (INDEPENDENT_AMBULATORY_CARE_PROVIDER_SITE_OTHER): Payer: Self-pay | Admitting: Family Medicine

## 2016-09-14 ENCOUNTER — Ambulatory Visit (INDEPENDENT_AMBULATORY_CARE_PROVIDER_SITE_OTHER): Payer: PRIVATE HEALTH INSURANCE | Admitting: Family Medicine

## 2016-09-14 VITALS — BP 138/94 | HR 98 | Temp 98.1°F | Resp 16 | Ht 63.0 in | Wt 275.0 lb

## 2016-09-14 DIAGNOSIS — IMO0001 Reserved for inherently not codable concepts without codable children: Secondary | ICD-10-CM

## 2016-09-14 DIAGNOSIS — G43009 Migraine without aura, not intractable, without status migrainosus: Secondary | ICD-10-CM

## 2016-09-14 DIAGNOSIS — Z6841 Body Mass Index (BMI) 40.0 and over, adult: Secondary | ICD-10-CM

## 2016-09-14 DIAGNOSIS — E6609 Other obesity due to excess calories: Secondary | ICD-10-CM

## 2016-09-14 DIAGNOSIS — N904 Leukoplakia of vulva: Secondary | ICD-10-CM

## 2016-09-14 MED ORDER — BETAMETHASONE DIPROPIONATE 0.05 % EX CREA
TOPICAL_CREAM | Freq: Two times a day (BID) | CUTANEOUS | 1 refills | Status: DC
Start: 2016-09-14 — End: 2017-05-03

## 2016-09-14 MED ORDER — FLUTICASONE PROPIONATE 50 MCG/ACT NA SUSP
1.0000 | Freq: Every day | NASAL | 3 refills | Status: DC
Start: 2016-09-14 — End: 2017-05-03

## 2016-09-14 MED ORDER — BUTALBITAL-APAP-CAFFEINE 50-325-40 MG PO TABS
1.0000 | ORAL_TABLET | Freq: Four times a day (QID) | ORAL | 0 refills | Status: DC | PRN
Start: 2016-09-14 — End: 2017-02-07

## 2016-09-14 NOTE — Progress Notes (Signed)
Subjective:      Date: 09/14/2016 3:12 PM   Patient ID: Audrey Franklin is a 26 y.o. female.    Chief Complaint:  Chief Complaint   Patient presents with   . Rash   . Obesity   . Headache       HPI:  Rash   This is a new problem. Episode onset: 04/2016. The problem has been waxing and waning since onset. The affected locations include the genitalia (perineum). The rash is characterized by pain, itchiness and burning. She was exposed to nothing. Past treatments include nothing (pt diagnosed with lichen sclerosis by gynecology).   Headache    This is a chronic problem. The current episode started more than 1 year ago. The problem occurs intermittently. The problem has been waxing and waning. The pain is located in the bilateral region. The pain does not radiate. The quality of the pain is described as aching and band-like. The pain is mild. Associated symptoms include nausea. The symptoms are aggravated by emotional stress. She has tried NSAIDs (fioricet) for the symptoms. The treatment provided moderate relief.   Obesity  Patient complains of obesity. Patient cites health, increased physical ability, self-image as reasons for wanting to lose weight. Patient is interested in starting an anti-inflammatory weight loss diet. She is also considering trying Victoza. She believes recent weight gain is due to her psych meds.      Problem List:  Patient Active Problem List   Diagnosis   . PCOS (polycystic ovarian syndrome)   . Morbid obesity with BMI of 40.0-44.9, adult   . Small intestinal bacterial overgrowth   . Anxiety   . Moderate episode of recurrent major depressive disorder   . Irritable bowel syndrome with both constipation and diarrhea   . Mixed hyperlipidemia   . Idiopathic urticaria   . Seasonal allergic rhinitis due to pollen       Current Medications:  Current Outpatient Prescriptions   Medication Sig Dispense Refill   . ALPRAZolam (XANAX XR) 0.5 MG 24 hr tablet Take 0.5 mg by mouth every morning.     Marland Kitchen atomoxetine  (STRATTERA) 60 MG capsule Take 60 mg by mouth daily.     Marland Kitchen BLISOVI FE 1/20 1-20 MG-MCG per tablet TAKE 1 TABLET BY MOUTH DAILY. 28 tablet 6   . buPROPion XL (WELLBUTRIN XL) 150 MG 24 hr tablet      . cetirizine (ZYRTEC) 10 MG tablet Take 10 mg by mouth 2 (two) times daily.     . famotidine (PEPCID) 20 MG tablet Take 20 mg by mouth every 12 (twelve) hours.     . fluticasone (FLONASE) 50 MCG/ACT nasal spray 1 spray by Nasal route daily.     . metFORMIN (GLUCOPHAGE-XR) 500 MG 24 hr tablet Take 2 tablets (1,000 mg total) by mouth daily with dinner. 180 tablet 1   . VIIBRYD 40 MG Tab        No current facility-administered medications for this visit.        Allergies:  Allergies   Allergen Reactions   . Bee Pollen    . Ceclor [Cefaclor] Hives   . Fruit & Vegetable Daily [Fish Oil]    . Other        Past Medical History:  Past Medical History:   Diagnosis Date   . ADHD (attention deficit hyperactivity disorder)    . Anxiety    . Eczema    . Fatigue    . Heartburn    .  Kidney stones    . PCOS (polycystic ovarian syndrome)    . Small intestinal bacterial overgrowth    . Tubular adenoma        Past Surgical History:  Past Surgical History:   Procedure Laterality Date   . KIDNEY STONE SURGERY     . wisdome teeth         Family History:  Family History   Problem Relation Age of Onset   . Diabetes Mother    . Hypertension Mother    . Cancer Paternal Aunt    . Stroke Paternal Grandfather        Social History:  Social History     Social History   . Marital status: Single     Spouse name: N/A   . Number of children: N/A   . Years of education: N/A     Occupational History   . Not on file.     Social History Main Topics   . Smoking status: Never Smoker   . Smokeless tobacco: Never Used   . Alcohol use No   . Drug use: No   . Sexual activity: No     Other Topics Concern   . Not on file     Social History Narrative   . No narrative on file       The following sections were reviewed this encounter by the provider:           ROS:  General/Constitutional:   Denies Chills. Denies Fatigue. Denies Fever.   Ophthalmologic:   Denies Blurred vision.   ENT:   Denies Nasal Discharge. Denies Sinus pain. Denies Sore throat.   Respiratory:   Denies Cough. Denies Shortness of breath. Denies Wheezing.   Cardiovascular:   Denies Chest pain. Denies Chest pain with exertion. Denies Palpitations.   Gastrointestinal:   Denies Abdominal pain. Denies Constipation. Denies Diarrhea. Admits Nausea associated with headaches. Denies Vomiting.   Skin:   Admits Rash.   Neurologic:   Denies Dizziness. Admits Headache. Denies Tingling/Numbness.        Objective:     BP (!) 138/94 (BP Site: Left arm, Patient Position: Sitting, Cuff Size: Large)   Pulse 98   Temp 98.1 F (36.7 C) (Oral)   Resp 16   Ht 1.6 m (5\' 3" )   Wt 124.7 kg (275 lb)   SpO2 98%   BMI 48.71 kg/m     Physical Exam:  General Examination:   GENERAL APPEARANCE: alert, in no acute distress, well developed, well nourished, oriented to time, place, and person.   ORAL CAVITY: normal oropharynx, normal lips, mucosa moist.   HEART: S1, S2 normal, no murmurs, rubs, gallops, regular rate and rhythm.   LUNGS: normal effort / no distress, normal breath sounds, clear to auscultation bilaterally, no wheezes, rales, rhonchi.   EXTREMITIES: no clubbing, cyanosis, or edema bilaterally.   GENITALIA: hypopigmented rash of perineum with raw, excoriated areas.   NEUROLOGIC: alert and oriented.        Assessment/Plan:       1. Lichen sclerosus of female genitalia  - betamethasone dipropionate (DIPROLENE) 0.05 % cream; Apply topically 2 (two) times daily.  Dispense: 45 g; Refill: 1  - Obstetrics and Gynecology Referral: Nyra Capes, MD Drue Dun)  Chronic, worsening. Patient may apply topical steroid cream to affected area-5 days on/5 days off. If no improvement after 2 weeks-follow up with Dr. Birder Robson. Side effects including skin thinning, hypopigmentation discussed with patient.    2. Migraine  without aura  and without status migrainosus, not intractable  - butalbital-acetaminophen-caffeine (FIORICET, ESGIC) 50-325-40 MG per tablet; Take 1 tablet by mouth every 6 (six) hours as needed for Headaches.  Dispense: 30 tablet; Refill: 0  - fluticasone (FLONASE) 50 MCG/ACT nasal spray; 1 spray by Nasal route daily.  Dispense: 3 Bottle; Refill: 3  Chronic, stable. Fioricet and Flonase renewed. Avoid skipping meals, continue with adequate hydration.    3. Class 3 obesity due to excess calories without serious comorbidity with body mass index (BMI) of 45.0 to 49.9 in adult  Chronic, worsening likely due to new psychotropic medication. Patient will try anti-inflammatory diet, if unsuccessful up with endocrinology to start Victoza.        Carney Bern, MD

## 2016-09-15 ENCOUNTER — Encounter (INDEPENDENT_AMBULATORY_CARE_PROVIDER_SITE_OTHER): Payer: Self-pay | Admitting: Family Medicine

## 2016-09-16 ENCOUNTER — Telehealth (INDEPENDENT_AMBULATORY_CARE_PROVIDER_SITE_OTHER): Payer: Self-pay

## 2016-09-16 NOTE — Telephone Encounter (Signed)
Pt doesn't take calcium supplements due to h/o kidney stones.  Is there another way to boost calcium or is it okay for her to take a calcium supplement?

## 2016-09-17 NOTE — Telephone Encounter (Signed)
Attempted to call; vm is full

## 2016-09-17 NOTE — Telephone Encounter (Signed)
She can increase the intake of calcium rich foods- green leafy vegetables, soymilk/almond milk/regualr milk (they are all fortified with calcium), yogurt and cheese. 3-4 servings of the food groups listed above/ day

## 2016-09-29 ENCOUNTER — Ambulatory Visit (INDEPENDENT_AMBULATORY_CARE_PROVIDER_SITE_OTHER): Payer: PRIVATE HEALTH INSURANCE | Admitting: Internal Medicine

## 2016-11-08 ENCOUNTER — Encounter (INDEPENDENT_AMBULATORY_CARE_PROVIDER_SITE_OTHER): Payer: Self-pay | Admitting: Family Medicine

## 2016-11-16 ENCOUNTER — Encounter (INDEPENDENT_AMBULATORY_CARE_PROVIDER_SITE_OTHER): Payer: Self-pay | Admitting: Internal Medicine

## 2016-12-08 ENCOUNTER — Other Ambulatory Visit: Payer: PRIVATE HEALTH INSURANCE

## 2016-12-08 ENCOUNTER — Ambulatory Visit (INDEPENDENT_AMBULATORY_CARE_PROVIDER_SITE_OTHER): Payer: PRIVATE HEALTH INSURANCE | Admitting: Internal Medicine

## 2016-12-08 ENCOUNTER — Encounter (INDEPENDENT_AMBULATORY_CARE_PROVIDER_SITE_OTHER): Payer: Self-pay | Admitting: Internal Medicine

## 2016-12-08 VITALS — BP 130/71 | HR 121 | Resp 16 | Ht 63.0 in | Wt 282.0 lb

## 2016-12-08 DIAGNOSIS — R5383 Other fatigue: Secondary | ICD-10-CM

## 2016-12-08 DIAGNOSIS — Z8349 Family history of other endocrine, nutritional and metabolic diseases: Secondary | ICD-10-CM

## 2016-12-08 DIAGNOSIS — Z6841 Body Mass Index (BMI) 40.0 and over, adult: Secondary | ICD-10-CM

## 2016-12-08 DIAGNOSIS — E282 Polycystic ovarian syndrome: Secondary | ICD-10-CM

## 2016-12-08 DIAGNOSIS — E782 Mixed hyperlipidemia: Secondary | ICD-10-CM

## 2016-12-08 MED ORDER — SEMAGLUTIDE(0.25 OR 0.5MG/DOS) 2 MG/1.5ML SC SOPN
0.2500 mg | PEN_INJECTOR | SUBCUTANEOUS | 1 refills | Status: DC
Start: 2016-12-08 — End: 2017-05-03

## 2016-12-08 NOTE — Progress Notes (Signed)
Subjective:      Date: 12/08/2016 2:42 PM   Patient ID: Audrey Franklin is a 26 y.o. female.    Chief Complaint:  Chief Complaint   Patient presents with   . PCOS (polycystic ovarian syndrome)       HPI     Patient is seen for further management of PCOS. Patient having every other week menstrual periods.. Has been treated with multiple rounds of antibiotics and has not noticed any symptoms relief.     Taking junel and reports regular menses on the birth control. Had an unexpected menses after missing a few doses of birth control. Reports interval improvement of acne. No interval worsening of hirsutism and alopecia.    Seen by GI- Dr. Allena Franklin for chronic diarrhea. Weight gain of over 30 lbs within the last 1 year. Audrey Franklin has h/o thyroid dysfunction. Audrey Franklin is a known lesbian and reports good family support.  H/O heavy and irregular menses, improved on birth control.     Reports ongoing episodes of hives. Reports ongoing c/o heavy menses. Has diarrhea once in a while; frequency has overall decreased.  Had 2 periods last month. For the last 2 months, has had heavy and midcycle bleeding. Also notes sharp abdominal pain, which occasionally happens every 5-10 min. Has not been seen by Gyn. Recent significant weight gain is following change in her medications for mental illness.    Has had irregular menses recently, LMP was on 11/23/16.    Problem List:  Patient Active Problem List   Diagnosis   . PCOS (polycystic ovarian syndrome)   . Morbid obesity with BMI of 40.0-44.9, adult   . Small intestinal bacterial overgrowth   . Anxiety   . Moderate episode of recurrent major depressive disorder   . Irritable bowel syndrome with both constipation and diarrhea   . Mixed hyperlipidemia   . Idiopathic urticaria   . Seasonal allergic rhinitis due to pollen       Current Medications:  Current Outpatient Prescriptions   Medication Sig Dispense Refill   . ALPRAZolam (XANAX XR) 0.5 MG 24 hr tablet Take 0.5 mg by mouth as needed.         Marland Kitchen  atomoxetine (STRATTERA) 60 MG capsule Take 60 mg by mouth daily.     . betamethasone dipropionate (DIPROLENE) 0.05 % cream Apply topically 2 (two) times daily. (Patient taking differently: Apply topically as needed.    ) 45 g 1   . BLISOVI FE 1/20 1-20 MG-MCG per tablet TAKE 1 TABLET BY MOUTH DAILY. 28 tablet 6   . buPROPion XL (WELLBUTRIN XL) 150 MG 24 hr tablet      . butalbital-acetaminophen-caffeine (FIORICET, ESGIC) 50-325-40 MG per tablet Take 1 tablet by mouth every 6 (six) hours as needed for Headaches. 30 tablet 0   . cetirizine (ZYRTEC) 10 MG tablet Take 10 mg by mouth 2 (two) times daily.     . famotidine (PEPCID) 20 MG tablet Take 20 mg by mouth every 12 (twelve) hours.     . fluticasone (FLONASE) 50 MCG/ACT nasal spray 1 spray by Nasal route daily. 3 Bottle 3   . metFORMIN (GLUCOPHAGE-XR) 500 MG 24 hr tablet Take 2 tablets (1,000 mg total) by mouth daily with dinner. 180 tablet 1   . Omega-3 Fatty Acids (FISH OIL PO)      . VIIBRYD 40 MG Tab        No current facility-administered medications for this visit.  Allergies:  Allergies   Allergen Reactions   . Bee Pollen    . Ceclor [Cefaclor] Hives   . Fruit & Vegetable Daily [Fish Oil]    . Other        Past Medical History:  Past Medical History:   Diagnosis Date   . ADHD (attention deficit hyperactivity disorder)    . Anxiety    . Eczema    . Fatigue    . Heartburn    . Kidney stones    . PCOS (polycystic ovarian syndrome)    . Small intestinal bacterial overgrowth    . Tubular adenoma        Past Surgical History:  Past Surgical History:   Procedure Laterality Date   . KIDNEY STONE SURGERY     . wisdome teeth         Family History:  Family History   Problem Relation Age of Onset   . Diabetes Mother    . Hypertension Mother    . Cancer Paternal Aunt    . Stroke Paternal Grandfather        Social History:  Social History     Social History   . Marital status: Single     Spouse name: N/A   . Number of children: N/A   . Years of education: N/A      Occupational History   . Not on file.     Social History Main Topics   . Smoking status: Never Smoker   . Smokeless tobacco: Never Used   . Alcohol use No   . Drug use: No   . Sexual activity: No     Other Topics Concern   . Not on file     Social History Narrative   . No narrative on file       The following portions of the patient's history were reviewed and updated as appropriate: allergies, current medications, past family history, past medical history, past social history, past surgical history and problem list.    ROS: A complete 12 point ROS was obtained. Pertinent positives and negatives as noted in HPI. All other systems are negative.    Vitals:  BP 130/71   Pulse (!) 121   Resp 16   Ht 1.6 m (5\' 3" )   Wt 127.9 kg (282 lb)   SpO2 97%   BMI 49.95 kg/m    Wt Readings from Last 3 Encounters:   12/08/16 127.9 kg (282 lb)   09/14/16 124.7 kg (275 lb)   09/09/16 124.1 kg (273 lb 9.6 oz)     Body mass index is 49.95 kg/m.      Physical Exam   Constitutional: She is oriented to person, place, and time. She appears well-developed and well-nourished.   Head: Normocephalic and atraumatic.   Mouth/Throat: Oropharynx is clear and moist.   Eyes: Conjunctivae and EOM are normal. Pupils are equal, round, and reactive to light.   Neck: Normal range of motion. Neck supple. No thyromegaly present.   Cardiovascular: Normal rate, regular rhythm, normal heart sounds and intact distal pulses.    Pulmonary/Chest: Effort normal and breath sounds normal.   Abdominal: Soft. Bowel sounds are normal. She exhibits no distension. There is no tenderness.   Musculoskeletal: Normal range of motion. She exhibits no edema or tenderness.   No tremors on outstretched arms.   Lymphadenopathy: She has no cervical adenopathy.   Neurological: She is alert and oriented to person, place, and time. She has normal  reflexes. No cranial nerve deficit.   Skin: Skin is warm and dry.  No hyperpigmentation, violaceous striae or acanthosis  nigricans. + facial acne  Psychiatric: She has a normal mood and affect.     ASSESSMENT AND PLAN    1. PCOS (polycystic ovarian syndrome)  Menses regular on OCPs, interval improvement in acne, hirsutism and alopecia. Metformin has been well tolerated and no episodes of worsening diarrhea at present.    - Comprehensive metabolic panel  - TSH  - T4, free  - Hemoglobin A1C  - DHEA-sulfate  - Testosterone    2. Morbid obesity with BMI of 40.0-44.9, adult  Recommend calorie restricted diet and exercise for weight loss. Recent weight gain following change in her medications. Discussed initiation of ozempic to help with weight loss. Risk & Benefits of the new medication(s) were explained to the patient (and family) who verbalized understanding & agreed to the treatment plan. Patient (family) encouraged to contact me/clinical staff with any questions/concerns    3. Body mass index 40.0-44.9, adult    4. Hyperlipidemia  Reports inconsistent diet; discussed that birth control pills could be related to higher cholesterol levels.  Lab Results   Component Value Date    CHOL 234 (H) 09/09/2016    CHOL 257 (H) 10/02/2015    CHOL 276 (H) 07/19/2015     Lab Results   Component Value Date    HDL 52 09/09/2016    HDL 59 10/02/2015     Lab Results   Component Value Date    LDL 118 (H) 09/09/2016    LDL 146 (H) 10/02/2015     Lab Results   Component Value Date    TRIG 319 (H) 09/09/2016    TRIG 261 (H) 10/02/2015    TRIG 259 (H) 07/19/2015       RTC in 3 months    Tanya Nones MD MPH  Endocrinologist, [facility]

## 2016-12-09 LAB — COMPREHENSIVE METABOLIC PANEL
ALT: 11 IU/L (ref 0–32)
AST (SGOT): 16 IU/L (ref 0–40)
Albumin/Globulin Ratio: 1.4 (ref 1.2–2.2)
Albumin: 4.2 g/dL (ref 3.5–5.5)
Alkaline Phosphatase: 83 IU/L (ref 39–117)
BUN / Creatinine Ratio: 11 (ref 9–23)
BUN: 9 mg/dL (ref 6–20)
Bilirubin, Total: 0.2 mg/dL (ref 0.0–1.2)
CO2: 24 mmol/L (ref 18–29)
Calcium: 9 mg/dL (ref 8.7–10.2)
Chloride: 98 mmol/L (ref 96–106)
Creatinine: 0.81 mg/dL (ref 0.57–1.00)
EGFR: 102 mL/min/{1.73_m2} (ref >59)
EGFR: 118 mL/min/{1.73_m2} (ref >59)
Globulin, Total: 3.1 g/dL (ref 1.5–4.5)
Glucose: 92 mg/dL (ref 65–99)
Potassium: 4.5 mmol/L (ref 3.5–5.2)
Protein, Total: 7.3 g/dL (ref 6.0–8.5)
Sodium: 139 mmol/L (ref 134–144)

## 2016-12-09 LAB — DHEA-SULFATE: DHEA-SO4: 167.4 ug/dL (ref 110.0–431.7)

## 2016-12-09 LAB — T3, FREE: T3, Free: 2.7 pg/mL (ref 2.0–4.4)

## 2016-12-09 LAB — TSH: TSH: 1.6 u[IU]/mL (ref 0.450–4.500)

## 2016-12-09 LAB — THYROID PEROXIDASE ANTIBODY: Thyroid Peroxidase (TPO) AB: 9 IU/mL (ref 0–34)

## 2016-12-09 LAB — TESTOSTERONE: Testosterone: 32 ng/dL (ref 8–48)

## 2016-12-09 LAB — T4, FREE: T4, Free: 1.06 ng/dL (ref 0.82–1.77)

## 2016-12-09 LAB — HEMOGLOBIN A1C: Hemoglobin A1C: 5.1 % (ref 4.8–5.6)

## 2017-01-03 ENCOUNTER — Encounter (INDEPENDENT_AMBULATORY_CARE_PROVIDER_SITE_OTHER): Payer: Self-pay | Admitting: Family Medicine

## 2017-01-05 ENCOUNTER — Other Ambulatory Visit (INDEPENDENT_AMBULATORY_CARE_PROVIDER_SITE_OTHER): Payer: Self-pay | Admitting: Internal Medicine

## 2017-01-10 ENCOUNTER — Emergency Department
Admission: EM | Admit: 2017-01-10 | Discharge: 2017-01-11 | Disposition: A | Discharge status: Home / Self Care | Payer: PRIVATE HEALTH INSURANCE | Attending: Emergency Medicine | Admitting: Emergency Medicine

## 2017-01-10 DIAGNOSIS — N83201 Unspecified ovarian cyst, right side: Secondary | ICD-10-CM | POA: Insufficient documentation

## 2017-01-10 DIAGNOSIS — Z7984 Long term (current) use of oral hypoglycemic drugs: Secondary | ICD-10-CM | POA: Insufficient documentation

## 2017-01-10 DIAGNOSIS — R1031 Right lower quadrant pain: Secondary | ICD-10-CM

## 2017-01-10 DIAGNOSIS — E282 Polycystic ovarian syndrome: Secondary | ICD-10-CM | POA: Insufficient documentation

## 2017-01-10 DIAGNOSIS — Z87442 Personal history of urinary calculi: Secondary | ICD-10-CM | POA: Insufficient documentation

## 2017-01-10 LAB — CBC AND DIFFERENTIAL
Absolute NRBC: 0 10*3/uL
Basophils Absolute Automated: 0.05 10*3/uL (ref 0.00–0.20)
Basophils Automated: 0.4 %
Eosinophils Absolute Automated: 0.19 10*3/uL (ref 0.00–0.70)
Eosinophils Automated: 1.5 %
Hematocrit: 39.5 % (ref 37.0–47.0)
Hgb: 13.2 g/dL (ref 12.0–16.0)
Immature Granulocytes Absolute: 0.06 10*3/uL — ABNORMAL HIGH
Immature Granulocytes: 0.5 %
Lymphocytes Absolute Automated: 2.92 10*3/uL (ref 0.50–4.40)
Lymphocytes Automated: 22.6 %
MCH: 27.8 pg — ABNORMAL LOW (ref 28.0–32.0)
MCHC: 33.4 g/dL (ref 32.0–36.0)
MCV: 83.3 fL (ref 80.0–100.0)
MPV: 9 fL — ABNORMAL LOW (ref 9.4–12.3)
Monocytes Absolute Automated: 0.72 10*3/uL (ref 0.00–1.20)
Monocytes: 5.6 %
Neutrophils Absolute: 8.99 10*3/uL — ABNORMAL HIGH (ref 1.80–8.10)
Neutrophils: 69.4 %
Nucleated RBC: 0 /100 WBC (ref 0.0–1.0)
Platelets: 259 10*3/uL (ref 140–400)
RBC: 4.74 10*6/uL (ref 4.20–5.40)
RDW: 14 % (ref 12–15)
WBC: 12.93 10*3/uL — ABNORMAL HIGH (ref 3.50–10.80)

## 2017-01-10 LAB — COMPREHENSIVE METABOLIC PANEL
ALT: 10 U/L (ref 0–55)
AST (SGOT): 15 U/L (ref 5–34)
Albumin/Globulin Ratio: 1 (ref 0.9–2.2)
Albumin: 3.5 g/dL (ref 3.5–5.0)
Alkaline Phosphatase: 77 U/L (ref 37–106)
Anion Gap: 11 (ref 5.0–15.0)
BUN: 10.1 mg/dL (ref 7.0–19.0)
Bilirubin, Total: 0.3 mg/dL (ref 0.2–1.2)
CO2: 24 mEq/L (ref 22–29)
Calcium: 9.7 mg/dL (ref 8.5–10.5)
Chloride: 105 mEq/L (ref 100–111)
Creatinine: 0.8 mg/dL (ref 0.6–1.0)
Globulin: 3.6 g/dL (ref 2.0–3.6)
Glucose: 82 mg/dL (ref 70–100)
Potassium: 4.5 mEq/L (ref 3.5–5.1)
Protein, Total: 7.1 g/dL (ref 6.0–8.3)
Sodium: 140 mEq/L (ref 136–145)

## 2017-01-10 LAB — URINALYSIS, REFLEX TO MICROSCOPIC EXAM IF INDICATED
Bilirubin, UA: NEGATIVE
Glucose, UA: NEGATIVE
Ketones UA: NEGATIVE
Leukocyte Esterase, UA: NEGATIVE
Nitrite, UA: NEGATIVE
Protein, UR: NEGATIVE
Specific Gravity UA: 1.017 (ref 1.001–1.035)
Urine pH: 6 (ref 5.0–8.0)
Urobilinogen, UA: NORMAL mg/dL

## 2017-01-10 LAB — GFR: EGFR: >60

## 2017-01-10 LAB — HCG, SERUM, QUALITATIVE: Hcg Qualitative: NEGATIVE

## 2017-01-10 MED ORDER — KETOROLAC TROMETHAMINE 30 MG/ML IJ SOLN
30.0000 mg | Freq: Once | INTRAMUSCULAR | Status: AC
Start: 2017-01-10 — End: 2017-01-10
  Administered 2017-01-10: 30 mg via INTRAVENOUS
  Filled 2017-01-10: qty 1

## 2017-01-10 MED ORDER — SODIUM CHLORIDE 0.9 % IV BOLUS
1000.0000 mL | Freq: Once | INTRAVENOUS | Status: AC
Start: 2017-01-10 — End: 2017-01-11
  Administered 2017-01-10: 23:00:00 1000 mL via INTRAVENOUS

## 2017-01-10 NOTE — ED Provider Notes (Signed)
Physician/Midlevel provider first contact with patient: 01/10/17 2219         History     Chief Complaint   Patient presents with   . Abdominal Pain     26 year old female presents to the emergency department with a sudden onset of right lower quadrant to the right flank and bladder pressure.  Started around 7:30, moderate to severe with mild nausea, no vomiting.  And no diarrhea or constipation.  Patient's had a history of kidney stones.  .  She is midcycle on birth control.  Never had a history of ovarian cysts has had multiple kidney stones with multiple CAT scans in the past.  Patient states that this is slightly different.  There is no radiation down her leg.  By the time I saw patient in the emergency room.  Patient states pain is a 1 out of 10      The history is provided by the patient and a relative.   Abdominal Pain   Pain location:  RLQ and suprapubic  Pain quality: aching    Pain radiates to:  R flank  Pain severity:  Moderate  Onset quality:  Sudden  Duration:  1 hour  Timing:  Constant  Progression:  Partially resolved  Chronicity:  New  Context: not awakening from sleep, not diet changes, not eating, not medication withdrawal, not sick contacts and not suspicious food intake    Relieved by:  Nothing  Worsened by:  Nothing  Ineffective treatments:  None tried  Associated symptoms: nausea    Associated symptoms: no anorexia, no chest pain, no chills, no constipation, no cough, no diarrhea, no dysuria, no fatigue, no fever, no flatus, no hematuria, no shortness of breath, no vaginal bleeding, no vaginal discharge and no vomiting             Past Medical History:   Diagnosis Date   . ADHD (attention deficit hyperactivity disorder)    . Anxiety    . Eczema    . Fatigue    . Heartburn    . Kidney stones    . PCOS (polycystic ovarian syndrome)    . Small intestinal bacterial overgrowth    . Tubular adenoma        Past Surgical History:   Procedure Laterality Date   . KIDNEY STONE SURGERY     . wisdome teeth          Family History   Problem Relation Age of Onset   . Diabetes Mother    . Hypertension Mother    . Cancer Paternal Aunt    . Stroke Paternal Grandfather        Social  Social History   Substance Use Topics   . Smoking status: Never Smoker   . Smokeless tobacco: Never Used   . Alcohol use No       .     Allergies   Allergen Reactions   . Bee Pollen    . Ceclor [Cefaclor] Hives       Home Medications     Med List Status:  In Progress Set By: Audrey Alstrom, RN at 01/10/2017 10:11 PM                ALPRAZolam (XANAX XR) 0.5 MG 24 hr tablet     Take 0.5 mg by mouth as needed.         atomoxetine (STRATTERA) 60 MG capsule     Take 60 mg by mouth daily.  betamethasone dipropionate (DIPROLENE) 0.05 % cream     Apply topically 2 (two) times daily.     Patient taking differently:  Apply topically as needed.         BLISOVI FE 1/20 1-20 MG-MCG per tablet     TAKE 1 TABLET BY MOUTH DAILY.     buPROPion XL (WELLBUTRIN XL) 150 MG 24 hr tablet          butalbital-acetaminophen-caffeine (FIORICET, ESGIC) 50-325-40 MG per tablet     Take 1 tablet by mouth every 6 (six) hours as needed for Headaches.     cetirizine (ZYRTEC) 10 MG tablet     Take 10 mg by mouth 2 (two) times daily.     famotidine (PEPCID) 20 MG tablet     Take 20 mg by mouth every 12 (twelve) hours.     fluticasone (FLONASE) 50 MCG/ACT nasal spray     1 spray by Nasal route daily.     metFORMIN (GLUCOPHAGE-XR) 500 MG 24 hr tablet     TAKE 2 TABLETS A DAY WITH DINNER     Omega-3 Fatty Acids (FISH OIL PO)          VIIBRYD 40 MG Tab                Flagged for Removal             Semaglutide (OZEMPIC) 0.25 or 0.5 MG/DOSE Solution Pen-injector     Inject 0.25 mg into the skin once a week.           Review of Systems   Constitutional: Negative for chills, fatigue and fever.   HENT: Negative for congestion.    Respiratory: Negative for cough and shortness of breath.    Cardiovascular: Negative for chest pain.   Gastrointestinal: Positive for abdominal pain  and nausea. Negative for anorexia, constipation, diarrhea, flatus and vomiting.   Genitourinary: Positive for flank pain. Negative for dysuria, hematuria, vaginal bleeding and vaginal discharge.   Musculoskeletal: Positive for back pain. Negative for neck pain.   Skin: Negative for rash.   Neurological: Negative for light-headedness, numbness and headaches.   Hematological: Does not bruise/bleed easily.   Psychiatric/Behavioral: The patient is nervous/anxious.        Physical Exam    BP: (!) 164/95, Heart Rate: (!) 108, Temp: 98.2 F (36.8 C), Resp Rate: 16, SpO2: 99 %, Weight: 127 kg    Physical Exam   Constitutional: She is oriented to person, place, and time. She appears well-developed and well-nourished.   HENT:   Head: Normocephalic and atraumatic.   Eyes: EOM are normal. Pupils are equal, round, and reactive to light. No scleral icterus.   Neck: Normal range of motion. Neck supple.   Cardiovascular: Normal rate, regular rhythm, normal heart sounds and intact distal pulses.    No murmur heard.  Pulmonary/Chest: Effort normal and breath sounds normal. No respiratory distress. She has no rales. She exhibits no tenderness.   Abdominal: Soft. Bowel sounds are normal. There is tenderness in the right upper quadrant, epigastric area and suprapubic area. There is no rebound, no guarding, no CVA tenderness, no tenderness at McBurney's point and negative Murphy's sign.       Musculoskeletal: Normal range of motion. She exhibits no tenderness.   Lymphadenopathy:     She has no cervical adenopathy.   Neurological: She is alert and oriented to person, place, and time.   Skin: Skin is warm and dry. Capillary refill takes less than  2 seconds.   Psychiatric: Her behavior is normal. Her mood appears anxious.   Nursing note and vitals reviewed.        MDM and ED Course     ED Medication Orders     Start Ordered     Status Ordering Provider    01/10/17 2353 01/10/17 2352  ketorolac (TORADOL) injection 30 mg  Once     Route:  Intravenous  Ordered Dose: 30 mg     Ordered Chalmer Zheng    01/10/17 2220 01/10/17 2219  sodium chloride 0.9 % bolus 1,000 mL  Once     Route: Intravenous  Ordered Dose: 1,000 mL     Last MAR action:  New Bag Audrey Franklin             MDM  Number of Diagnoses or Management Options  Cyst of right ovary:   RLQ abdominal pain:   Diagnosis management comments: Dr. Cathlean Marseilles  is the primary attending for this patient and has obtained and performed the history, PE, and medical decision making for this patient.    Oxygen saturation by pulse oximetry is 95%-100%, Normal.  Interventions: Patient Observed.    Differential diagnoses kidney stone, urinary tract infection, constipation, low suspicion for appendicitis or surgical.    11:30.  Patient states pain improved slightly, still present down to a 3.  Discussed in detail laboratory work.  We will do an ultrasound of her ovaries as well as her kidneys.  Because patient's had multiple CAT scans I will give Toradol for discomfort.      Reviewed sono report with pt and brother recommended f/u , pt stable for d/c        Amount and/or Complexity of Data Reviewed  Clinical lab tests: ordered and reviewed  Tests in the radiology section of CPT: ordered and reviewed  Review and summarize past medical records: yes               Labs Reviewed   CBC AND DIFFERENTIAL - Abnormal; Notable for the following:        Result Value    WBC 12.93 (*)     MCH 27.8 (*)     MPV 9.0 (*)     Neutrophils Absolute 8.99 (*)     Absolute Immature Granulocyte 0.06 (*)     All other components within normal limits   URINALYSIS, REFLEX TO MICROSCOPIC EXAM IF INDICATED - Abnormal; Notable for the following:     Clarity, UA Sl Cloudy (*)     Blood, UA Small (*)     All other components within normal limits   COMPREHENSIVE METABOLIC PANEL   HCG, SERUM, QUALITATIVE   GFR     US Pelvic with Transvaginal (r/o torsion)   Final Result          Small amount of free fluid in the pelvis possibly due to  rupture of a   right ovarian follicle/cyst.      No torsion of the right ovary.      Left ovary not seen.      Johnsie Kindred, MD    01/11/2017 12:59 AM      US Kidney/Bladder   Final Result          No acute abnormality.      Johnsie Kindred, MD    01/11/2017 12:54 AM              Procedures    Clinical Impression & Disposition  Clinical Impression  Final diagnoses:   None        ED Disposition     None           New Prescriptions    No medications on file                 Audrey Reeves, MD  01/14/17 1015

## 2017-01-10 NOTE — ED Triage Notes (Signed)
Onset of abdominal pain/pressure that started 90- minutes ago, in RLQ pain.  Has nausea.  No vomiting, diarrhea, urinary complaints, or any injuries/complaints to include CP/TB.  Denies weapons.

## 2017-01-11 ENCOUNTER — Emergency Department: Payer: PRIVATE HEALTH INSURANCE

## 2017-01-11 NOTE — Discharge Instructions (Signed)
Abdominal Pain     You have been diagnosed with abdominal (belly) pain. The cause of your pain is not yet known.     Many things can cause abdominal pain. Examples include viral infections and bowel (intestine) spasms. You might need another examination or more tests to find out why you have pain.     At this time, your pain does not seem to be caused by anything dangerous. You do not need surgery. You do not need to stay in the hospital.      Though we don’t believe your condition is dangerous right now, it is important to be careful. Sometimes a problem that seems mild can become serious later. This is why it is very important that you return here or go to the nearest Emergency Department unless you are 100% improved.     Return here or go to the nearest Emergency Department, or follow up with your physician in:  · 24 hours.     Drink only clear liquids such as water, clear broth, sports drinks, or clear caffeine-free soft drinks, like 7-Up or Sprite, for the next:  · 24 hours.     YOU SHOULD SEEK MEDICAL ATTENTION IMMEDIATELY, EITHER HERE OR AT THE NEAREST EMERGENCY DEPARTMENT, IF ANY OF THE FOLLOWING OCCURS:  · Your pain does not go away or gets worse.  · You cannot keep fluids down or your vomit is dark green.   · You vomit blood or see blood in your stool. Blood might be bright red or dark red. It can also be black and look like tar.  · You have a fever (temperature higher than 100.4ºF / 38ºC) or shaking chills.  · Your skin or eyes look yellow or your urine looks brown.  · You have severe diarrhea.

## 2017-01-26 NOTE — Progress Notes (Signed)
Lab results are in the normal range.

## 2017-02-03 ENCOUNTER — Other Ambulatory Visit (INDEPENDENT_AMBULATORY_CARE_PROVIDER_SITE_OTHER): Payer: Self-pay | Admitting: Internal Medicine

## 2017-02-07 ENCOUNTER — Encounter (INDEPENDENT_AMBULATORY_CARE_PROVIDER_SITE_OTHER): Payer: Self-pay | Admitting: Family Medicine

## 2017-02-07 ENCOUNTER — Other Ambulatory Visit (INDEPENDENT_AMBULATORY_CARE_PROVIDER_SITE_OTHER): Payer: Self-pay | Admitting: Family Medicine

## 2017-02-07 DIAGNOSIS — G43009 Migraine without aura, not intractable, without status migrainosus: Secondary | ICD-10-CM

## 2017-02-07 MED ORDER — BUTALBITAL-APAP-CAFFEINE 50-325-40 MG PO TABS
1.0000 | ORAL_TABLET | Freq: Four times a day (QID) | ORAL | 1 refills | Status: AC | PRN
Start: 2017-02-07 — End: ?

## 2017-02-22 LAB — IRON,TIBC AND FERRITIN PANEL: FERRITIN: 26

## 2017-02-22 LAB — VITAMIN D 25 HYDROXY (VIT D DEFICIENCY, FRACTURES): Vit D, 25-Hydroxy: 18.6

## 2017-02-22 LAB — BASIC METABOLIC PANEL
BUN: 8 (ref 4–21)
CREATININE: 0.7 (ref 0.5–1.1)
GLUCOSE: 83
POTASSIUM: 4.4 (ref 3.4–5.3)
SODIUM: 141 (ref 137–147)

## 2017-02-22 LAB — CBC AND DIFFERENTIAL
HCT: 42 (ref 36–46)
Hemoglobin: 13.4 (ref 12.0–16.0)
Neutrophils Absolute: 7
Platelets: 286 (ref 150–399)
WBC: 9.7

## 2017-02-22 LAB — HEPATIC FUNCTION PANEL
ALT: 13 (ref 7–35)
AST: 15 (ref 13–35)
Alkaline Phosphatase: 92 (ref 25–125)
BILIRUBIN, TOTAL: 0.3

## 2017-02-22 LAB — VITAMIN B12: Vitamin B-12: 228

## 2017-03-08 ENCOUNTER — Other Ambulatory Visit (INDEPENDENT_AMBULATORY_CARE_PROVIDER_SITE_OTHER): Payer: Self-pay | Admitting: Internal Medicine

## 2017-03-08 ENCOUNTER — Ambulatory Visit (INDEPENDENT_AMBULATORY_CARE_PROVIDER_SITE_OTHER): Payer: PRIVATE HEALTH INSURANCE | Admitting: Internal Medicine

## 2017-04-07 ENCOUNTER — Other Ambulatory Visit (INDEPENDENT_AMBULATORY_CARE_PROVIDER_SITE_OTHER): Payer: Self-pay | Admitting: Internal Medicine

## 2017-04-14 LAB — VITAMIN D 25 HYDROXY (VIT D DEFICIENCY, FRACTURES): VIT D 25 HYDROXY: 19

## 2017-04-14 LAB — VITAMIN B12: Vitamin B-12: 228

## 2017-04-14 LAB — BASIC METABOLIC PANEL: GLUCOSE: 83

## 2017-04-14 LAB — IRON,TIBC AND FERRITIN PANEL: Ferritin: 26

## 2017-05-03 ENCOUNTER — Encounter (INDEPENDENT_AMBULATORY_CARE_PROVIDER_SITE_OTHER): Payer: Self-pay | Admitting: Family Medicine

## 2017-05-03 ENCOUNTER — Ambulatory Visit (INDEPENDENT_AMBULATORY_CARE_PROVIDER_SITE_OTHER): Payer: No Typology Code available for payment source | Admitting: Family Medicine

## 2017-05-03 VITALS — BP 127/84 | HR 103 | Temp 98.0°F | Ht 62.75 in | Wt 290.0 lb

## 2017-05-03 DIAGNOSIS — L508 Other urticaria: Secondary | ICD-10-CM

## 2017-05-03 DIAGNOSIS — G43009 Migraine without aura, not intractable, without status migrainosus: Secondary | ICD-10-CM

## 2017-05-03 DIAGNOSIS — Z23 Encounter for immunization: Secondary | ICD-10-CM

## 2017-05-03 DIAGNOSIS — E282 Polycystic ovarian syndrome: Secondary | ICD-10-CM

## 2017-05-03 MED ORDER — FLUTICASONE PROPIONATE 50 MCG/ACT NA SUSP
1.0000 | Freq: Every day | NASAL | 3 refills | Status: AC
Start: 2017-05-03 — End: ?

## 2017-05-03 MED ORDER — EPINEPHRINE 0.3 MG/0.3ML IJ SOAJ
0.3000 mg | Freq: Once | INTRAMUSCULAR | 2 refills | Status: AC
Start: 2017-05-03 — End: 2017-05-03

## 2017-05-03 MED ORDER — METFORMIN HCL ER 500 MG PO TB24
ORAL_TABLET | ORAL | 3 refills | Status: DC
Start: 2017-05-03 — End: 2017-05-06

## 2017-05-03 MED ORDER — CETIRIZINE HCL 10 MG PO TABS
10.0000 mg | ORAL_TABLET | Freq: Two times a day (BID) | ORAL | 3 refills | Status: AC
Start: 2017-05-03 — End: ?

## 2017-05-03 MED ORDER — FAMOTIDINE 20 MG PO TABS
20.0000 mg | ORAL_TABLET | Freq: Two times a day (BID) | ORAL | 3 refills | Status: AC
Start: 2017-05-03 — End: ?

## 2017-05-03 MED ORDER — NORETHIN ACE-ETH ESTRAD-FE 1-20 MG-MCG PO TABS
ORAL_TABLET | ORAL | 3 refills | Status: AC
Start: 2017-05-03 — End: ?

## 2017-05-03 NOTE — Progress Notes (Addendum)
Subjective:      Date: 05/03/2017 4:36 PM   Patient ID: Audrey Franklin is a 26 y.o. female.    Chief Complaint:  Chief Complaint   Patient presents with   . Urticaria   . Migraine   . Other     PCOS       HPI:  Urticaria   This is a chronic problem. Location: diffuse-widespread body. The rash is characterized by itchiness and redness. Past treatments include antihistamine. The treatment provided moderate relief. Her past medical history is significant for allergies.   Migraine    This is a chronic problem. The current episode started more than 1 year ago. The problem occurs intermittently. Progression since onset: stable. The pain is located in the bilateral region. The pain does not radiate. The quality of the pain is described as aching. The pain is mild. Treatments tried: flonase. The treatment provided moderate relief.     PCOS  Patient has a history of PCOS. Has been taking junel and metformin for treatment which help to regulate periods. Patient believes she has a cortisol problem which is actually the source of her PCOS and she has been following up with a functional medicine doctor for treatment.      Problem List:  Patient Active Problem List   Diagnosis   . PCOS (polycystic ovarian syndrome)   . Morbid obesity with BMI of 40.0-44.9, adult   . Small intestinal bacterial overgrowth   . Anxiety   . Moderate episode of recurrent major depressive disorder   . Irritable bowel syndrome with both constipation and diarrhea   . Mixed hyperlipidemia   . Idiopathic urticaria   . Seasonal allergic rhinitis due to pollen       Current Medications:  Current Outpatient Prescriptions   Medication Sig Dispense Refill   . ALPRAZolam (XANAX XR) 0.5 MG 24 hr tablet Take 0.5 mg by mouth as needed.         Marland Kitchen atomoxetine (STRATTERA) 60 MG capsule Take 60 mg by mouth daily.     Marland Kitchen BLISOVI FE 1/20 1-20 MG-MCG per tablet TAKE 1 TABLET BY MOUTH DAILY. 28 tablet 6   . buPROPion XL (WELLBUTRIN XL) 150 MG 24 hr tablet      .  butalbital-acetaminophen-caffeine (FIORICET, ESGIC) 50-325-40 MG per tablet Take 1 tablet by mouth every 6 (six) hours as needed for Headaches. 30 tablet 1   . cetirizine (ZYRTEC) 10 MG tablet Take 10 mg by mouth 2 (two) times daily.     . famotidine (PEPCID) 20 MG tablet Take 20 mg by mouth every 12 (twelve) hours.     . fluticasone (FLONASE) 50 MCG/ACT nasal spray 1 spray by Nasal route daily. 3 Bottle 3   . metFORMIN (GLUCOPHAGE-XR) 500 MG 24 hr tablet TAKE TWO TABLETS BY MOUTH DAILY WITH DINNER 60 tablet 0   . nystatin (MYCOSTATIN) 500000 units Tab Take 1 tablet by mouth 4 (four) times daily.     Marland Kitchen UNABLE TO FIND Hydrocortisone tabs 10 mg tablet --1 tab 8 am ,1/2 tab at 12  noon ,1/2 tab at 3pm         . VIIBRYD 40 MG Tab        No current facility-administered medications for this visit.        Allergies:  Allergies   Allergen Reactions   . Bee Pollen    . Ceclor [Cefaclor] Hives       Past Medical History:  Past Medical History:  Diagnosis Date   . ADHD (attention deficit hyperactivity disorder)    . Anxiety    . Eczema    . Fatigue    . Heartburn    . Kidney stones    . PCOS (polycystic ovarian syndrome)    . Small intestinal bacterial overgrowth    . Tubular adenoma        Past Surgical History:  Past Surgical History:   Procedure Laterality Date   . KIDNEY STONE SURGERY     . wisdome teeth         Family History:  Family History   Problem Relation Age of Onset   . Diabetes Mother    . Hypertension Mother    . Cancer Paternal Aunt    . Stroke Paternal Grandfather        Social History:  Social History     Social History   . Marital status: Single     Spouse name: N/A   . Number of children: N/A   . Years of education: N/A     Occupational History   . Not on file.     Social History Main Topics   . Smoking status: Never Smoker   . Smokeless tobacco: Never Used   . Alcohol use No   . Drug use: No   . Sexual activity: No     Other Topics Concern   . Not on file     Social History Narrative   . No narrative on  file       The following sections were reviewed this encounter by the provider:          ROS:  General/Constitutional:   Denies Chills. Denies Fatigue. Denies Fever.   Ophthalmologic:   Denies Blurred vision.   ENT:   Admits Nasal Discharge. Denies Sinus pain. Denies Sore throat.   Respiratory:   Denies Cough. Denies Shortness of breath. Denies Wheezing.   Cardiovascular:   Denies Chest pain. Denies Chest pain with exertion. Denies Palpitations.   Gastrointestinal:   Denies Abdominal pain. Denies Constipation. Denies Diarrhea.  Skin:   Denies Rash. Admits Hives.  Neurologic:   Denies Dizziness. Admits Headache. Denies Tingling/Numbness.          Objective:     BP 127/84 (BP Site: Left arm, Patient Position: Sitting, Cuff Size: Large)   Pulse (!) 103   Temp 98 F (36.7 C) (Oral)   Ht 1.594 m (5' 2.75")   Wt 131.5 kg (290 lb)   SpO2 97%   BMI 51.78 kg/m     Physical Exam:  General Examination:   GENERAL APPEARANCE: alert, in no acute distress, well developed, well nourished, oriented to time, place, and person.   ORAL CAVITY: normal oropharynx, normal lips, mucosa moist.   THROAT: normal appearance, clear.   HEART: S1, S2 normal, no murmurs, rubs, gallops, regular rate and rhythm.   LUNGS: normal effort / no distress, normal breath sounds, clear to auscultation bilaterally, no wheezes, rales, rhonchi.   EXTREMITIES: no clubbing, cyanosis, or edema bilaterally.   NEUROLOGIC: alert and oriented.          Assessment/Plan:       1. Chronic urticaria  - famotidine (PEPCID) 20 MG tablet; Take 1 tablet (20 mg total) by mouth every 12 (twelve) hours.  Dispense: 180 tablet; Refill: 3  - cetirizine (ZYRTEC) 10 MG tablet; Take 1 tablet (10 mg total) by mouth 2 (two) times daily.  Dispense: 180 tablet; Refill: 3  -  fluticasone (FLONASE) 50 MCG/ACT nasal spray; 1 spray by Nasal route daily.  Dispense: 3 Bottle; Refill: 3  - EPINEPHrine 0.3 MG/0.3ML Solution Auto-injector injection; Inject 0.3 mLs (0.3 mg total) into the  muscle once.for 1 dose  Dispense: 1 Device; Refill: 2  Chronic, stable. Medication renewed. Recommend trial of Xolair in the future-patient to discuss with future allergist.    2. Migraine without aura and without status migrainosus, not intractable  - fluticasone (FLONASE) 50 MCG/ACT nasal spray; 1 spray by Nasal route daily.  Dispense: 3 Bottle; Refill: 3  Chronic, stable. Continue flonase to help control sinus headaches.    3. PCOS (polycystic ovarian syndrome)  - norethindrone-ethinyl estradiol-FE (BLISOVI FE 1/20) 1-20 MG-MCG per tablet; TAKE 1 TABLET BY MOUTH DAILY.  Dispense: 84 tablet; Refill: 3  - metFORMIN (GLUCOPHAGE-XR) 500 MG 24 hr tablet; TAKE TWO TABLETS BY MOUTH DAILY WITH DINNER  Dispense: 180 tablet; Refill: 3  Chronic, stable. Continue current medications. Patient will continue to work with her functional medication doctor to improve her insulin resistance. Eventually she would like to discontinue this medication.    4. Need for influenza vaccination  - Flu vaccine QUAD PRES FREE 71YRS & GREATER  Risks/benefits reviewed.        Carney Bern, MD

## 2017-05-04 ENCOUNTER — Telehealth (INDEPENDENT_AMBULATORY_CARE_PROVIDER_SITE_OTHER): Payer: Self-pay

## 2017-05-04 NOTE — Telephone Encounter (Signed)
Pt was seen in office 05/03/17 request pulse report before year 2010 sent mychart message:  Sorry these are what I could find for pulse record  02/09/05--80 sick visit  3/27.08-- 100 sick visit  10/10/07--70  06/02/09--80

## 2017-05-06 ENCOUNTER — Other Ambulatory Visit (INDEPENDENT_AMBULATORY_CARE_PROVIDER_SITE_OTHER): Payer: Self-pay | Admitting: Internal Medicine

## 2017-05-06 DIAGNOSIS — E282 Polycystic ovarian syndrome: Secondary | ICD-10-CM

## 2017-05-18 ENCOUNTER — Other Ambulatory Visit (INDEPENDENT_AMBULATORY_CARE_PROVIDER_SITE_OTHER): Payer: Self-pay | Admitting: Family

## 2017-05-18 ENCOUNTER — Ambulatory Visit (INDEPENDENT_AMBULATORY_CARE_PROVIDER_SITE_OTHER): Payer: No Typology Code available for payment source | Admitting: Family

## 2017-05-18 ENCOUNTER — Encounter (INDEPENDENT_AMBULATORY_CARE_PROVIDER_SITE_OTHER): Payer: Self-pay | Admitting: Family

## 2017-05-18 VITALS — BP 124/69 | HR 106 | Temp 97.8°F | Resp 16

## 2017-05-18 DIAGNOSIS — R1031 Right lower quadrant pain: Secondary | ICD-10-CM

## 2017-05-18 LAB — POCT URINALYSIS AUTOMATED (IAH)
Bilirubin, UA POCT: NEGATIVE
Blood, UA POCT: NEGATIVE
Glucose, UA POCT: NEGATIVE
Ketones, UA POCT: NEGATIVE mg/dL
Nitrite, UA POCT: NEGATIVE
PH, UA POCT: 7 (ref 4.6–8)
Protein, UA POCT: NEGATIVE mg/dL
Specific Gravity, UA POCT: 1.015 mg/dL (ref 1.001–1.035)
Urine Leukocytes POCT: NEGATIVE
Urobilinogen, UA POCT: 0.2 mg/dL

## 2017-05-18 LAB — CBC AND DIFFERENTIAL
Absolute NRBC: 0 10*3/uL
Basophils Absolute Automated: 0.07 10*3/uL (ref 0.00–0.20)
Basophils Automated: 0.7 %
Eosinophils Absolute Automated: 0.18 10*3/uL (ref 0.00–0.70)
Eosinophils Automated: 1.9 %
Hematocrit: 42.9 % (ref 37.0–47.0)
Hgb: 13.4 g/dL (ref 12.0–16.0)
Immature Granulocytes Absolute: 0.03 10*3/uL
Immature Granulocytes: 0.3 %
Lymphocytes Absolute Automated: 2.17 10*3/uL (ref 0.50–4.40)
Lymphocytes Automated: 22.4 %
MCH: 26.8 pg — ABNORMAL LOW (ref 28.0–32.0)
MCHC: 31.2 g/dL — ABNORMAL LOW (ref 32.0–36.0)
MCV: 85.8 fL (ref 80.0–100.0)
MPV: 9.2 fL — ABNORMAL LOW (ref 9.4–12.3)
Monocytes Absolute Automated: 0.55 10*3/uL (ref 0.00–1.20)
Monocytes: 5.7 %
Neutrophils Absolute: 6.67 10*3/uL (ref 1.80–8.10)
Neutrophils: 69 %
Nucleated RBC: 0 /100 WBC (ref 0.0–1.0)
Platelets: 280 10*3/uL (ref 140–400)
RBC: 5 10*6/uL (ref 4.20–5.40)
RDW: 15 % (ref 12–15)
WBC: 9.67 10*3/uL (ref 3.50–10.80)

## 2017-05-18 LAB — POCT PREGNANCY TEST, URINE HCG: POCT Pregnancy HCG Test, UR: NEGATIVE

## 2017-05-18 NOTE — Progress Notes (Signed)
[facility]    PROGRESS NOTE      Patient: Audrey Franklin   Date: 05/18/2017   MRN: [id number]     Past Medical History:   Diagnosis Date   . ADHD (attention deficit hyperactivity disorder)    . Anxiety    . Eczema    . Fatigue    . Heartburn    . Kidney stones    . PCOS (polycystic ovarian syndrome)    . Small intestinal bacterial overgrowth    . Tubular adenoma      Social History     Social History   . Marital status: Single     Spouse name: N/A   . Number of children: N/A   . Years of education: N/A     Occupational History   . Not on file.     Social History Main Topics   . Smoking status: Never Smoker   . Smokeless tobacco: Never Used   . Alcohol use No   . Drug use: No   . Sexual activity: No     Other Topics Concern   . Not on file     Social History Narrative   . No narrative on file     Family History   Problem Relation Age of Onset   . Diabetes Mother    . Hypertension Mother    . Cancer Paternal Aunt    . Stroke Paternal Grandfather        ASSESSMENT/PLAN     Audrey Franklin is a 26 y.o. female    Chief Complaint   Patient presents with   . Abdominal Pain        1. RLQ abdominal pain  - POCT UA Clinitek AX (urine dipstick)  - POCT pregnancy, urine  - US Pelvis with Transvaginal; Future  - CBC and differential    UA WNL and urine pregnancy negative. Likely ovarian cyst based on Hx, however cannot fully r/o appendicitis. Will get [place] as ordered and f/u with CBC results. Reviewed s/s appendicitis with pt in detail. Tylenol/ ibuprofen as needed for pain, increase rest and fluids.    If ovarian cyst, recommend f/u with GYN.    Reviewed med use and side effects. Reviewed s/s that would warrant further and/ or immediate medical attention. Pt in agreement with plan and all questions answered.    Ddx: ovarian cyst, ovarian torsion, appendicitis, colitis     Results for orders placed or performed during the hospital encounter of 01/10/17   CBC with differential   Result Value Ref Range    WBC 12.93 (H) 3.50  - 10.80 x10 3/uL    Hgb 13.2 12.0 - 16.0 g/dL    Hematocrit 54.0 98.1 - 47.0 %    Platelets 259 140 - 400 x10 3/uL    RBC 4.74 4.20 - 5.40 x10 6/uL    MCV 83.3 80.0 - 100.0 fL    MCH 27.8 (L) 28.0 - 32.0 pg    MCHC 33.4 32.0 - 36.0 g/dL    RDW 14 12 - 15 %    MPV 9.0 (L) 9.4 - 12.3 fL    Neutrophils 69.4 None %    Lymphocytes Automated 22.6 None %    Monocytes 5.6 None %    Eosinophils Automated 1.5 None %    Basophils Automated 0.4 None %    Immature Granulocyte 0.5 None %    Nucleated RBC 0.0 0.0 - 1.0 /100 WBC    Neutrophils  Absolute 8.99 (H) 1.80 - 8.10 x10 3/uL    Abs Lymph Automated 2.92 0.50 - 4.40 x10 3/uL    Abs Mono Automated 0.72 0.00 - 1.20 x10 3/uL    Abs Eos Automated 0.19 0.00 - 0.70 x10 3/uL    Absolute Baso Automated 0.05 0.00 - 0.20 x10 3/uL    Absolute Immature Granulocyte 0.06 (H) 0 x10 3/uL    Absolute NRBC 0.00 0 x10 3/uL   Comprehensive metabolic panel   Result Value Ref Range    Glucose 82 70 - 100 mg/dL    BUN 11.9 7.0 - 14.7 mg/dL    Creatinine 0.8 0.6 - 1.0 mg/dL    Sodium 829 562 - 130 mEq/L    Potassium 4.5 3.5 - 5.1 mEq/L    Chloride 105 100 - 111 mEq/L    CO2 24 22 - 29 mEq/L    Calcium 9.7 8.5 - 10.5 mg/dL    Protein, Total 7.1 6.0 - 8.3 g/dL    Albumin 3.5 3.5 - 5.0 g/dL    AST (SGOT) 15 5 - 34 U/L    ALT 10 0 - 55 U/L    Alkaline Phosphatase 77 37 - 106 U/L    Bilirubin, Total 0.3 0.2 - 1.2 mg/dL    Globulin 3.6 2.0 - 3.6 g/dL    Albumin/Globulin Ratio 1.0 0.9 - 2.2    Anion Gap 11.0 5.0 - 15.0   hCG, Qualitative (Pos/Neg)   Result Value Ref Range    Hcg Qualitative Negative Negative   UA with reflex to micro (pts  3 + yrs)   Result Value Ref Range    Urine Type Clean Catch     Color, UA Yellow Clear - Yellow    Clarity, UA Sl Cloudy (A) Clear - Hazy    Specific Gravity UA 1.017 1.001 - 1.035    Urine pH 6.0 5.0 - 8.0    Leukocyte Esterase, UA Negative Negative    Nitrite, UA Negative Negative    Protein, UR Negative Negative    Glucose, UA Negative Negative    Ketones UA Negative  Negative    Urobilinogen, UA Normal 0.2 - 2.0 mg/dL    Bilirubin, UA Negative Negative    Blood, UA Small (A) Negative    RBC, UA 0-2 0 - 5 /hpf    WBC, UA 0-5 0 - 5 /hpf    Squamous Epithelial Cells, Urine 0-5 0 - 25 /hpf   GFR   Result Value Ref Range    EGFR >60.0        Risk & Benefits of the new medication(s) were explained to the patient (and family) who verbalized understanding & agreed to the treatment plan. Patient (family) encouraged to contact me/clinical staff with any questions/concerns      MEDICATIONS     Current Outpatient Prescriptions   Medication Sig Dispense Refill   . ALPRAZolam (XANAX XR) 0.5 MG 24 hr tablet Take 0.5 mg by mouth as needed.         Marland Kitchen atomoxetine (STRATTERA) 60 MG capsule Take 60 mg by mouth daily.     Marland Kitchen buPROPion XL (WELLBUTRIN XL) 150 MG 24 hr tablet      . butalbital-acetaminophen-caffeine (FIORICET, ESGIC) 50-325-40 MG per tablet Take 1 tablet by mouth every 6 (six) hours as needed for Headaches. 30 tablet 1   . cetirizine (ZYRTEC) 10 MG tablet Take 1 tablet (10 mg total) by mouth 2 (two) times daily. 180 tablet 3   .  famotidine (PEPCID) 20 MG tablet Take 1 tablet (20 mg total) by mouth every 12 (twelve) hours. 180 tablet 3   . fluticasone (FLONASE) 50 MCG/ACT nasal spray 1 spray by Nasal route daily. 3 Bottle 3   . metFORMIN (GLUCOPHAGE-XR) 500 MG 24 hr tablet TAKE TWO TABLETS BY MOUTH DAILY WITH DINNER 60 tablet 0   . norethindrone-ethinyl estradiol-FE (BLISOVI FE 1/20) 1-20 MG-MCG per tablet TAKE 1 TABLET BY MOUTH DAILY. 84 tablet 3   . nystatin (MYCOSTATIN) 500000 units Tab Take 1 tablet by mouth 4 (four) times daily.     Marland Kitchen UNABLE TO FIND Hydrocortisone tabs 10 mg tablet --1 tab 8 am ,1/2 tab at 12  noon ,1/2 tab at 3pm         . VIIBRYD 40 MG Tab        No current facility-administered medications for this visit.        Allergies   Allergen Reactions   . Ceclor [Cefaclor] Hives       SUBJECTIVE     Chief Complaint   Patient presents with   . Abdominal Pain        Pt  reports waking up this morning in a moderate amount of pain. Pt states pain is mostly localized to RLQ, however does radiate a little up to the RUQ. Patient states she "feels foggy" on the right side of her body.    She had an ovarian cyst rupture in May 2018. She has had kidney stones on the R side also but states this does not feel like a kidney stone.    She is halfway through her birth control cycle. Pt denies chance of pregnancy, reports she is gay and not sexually active.        Abdominal Pain   This is a new problem. Episode onset: this morning, woke up with it. The problem occurs intermittently. The pain is located in the RLQ. The quality of the pain is sharp. The abdominal pain radiates to the RUQ. Associated symptoms include diarrhea (this mroning x2). Pertinent negatives include no dysuria, fever, frequency, headaches, hematuria, nausea or vomiting. She has tried nothing for the symptoms. ovarian cysts       ROS     Review of Systems   Constitutional: Negative for chills, fatigue and fever.   Gastrointestinal: Positive for abdominal pain (right lower; radiates a little upward) and diarrhea (this mroning x2). Negative for blood in stool, nausea and vomiting.   Genitourinary: Negative for dysuria, frequency, hematuria, vaginal bleeding and vaginal pain.   Musculoskeletal: Negative for back pain.   Skin: Negative for wound.   Neurological: Negative for dizziness and headaches.        Mental fog and general feeling of unwellness     The following sections were reviewed this encounter by the provider:   Tobacco  Allergies  Meds  Med Hx  Surg Hx  Fam Hx  Soc Hx        PHYSICAL EXAM     Vitals:    05/18/17 0955   BP: 124/69   Pulse: (!) 106   Resp: 16   Temp: 97.8 F (36.6 C)   TempSrc: Oral   SpO2: 98%       Physical Exam   Constitutional: She is oriented to person, place, and time. Vital signs are normal. She appears well-developed and well-nourished.   HENT:   Head: Normocephalic and atraumatic.    Cardiovascular: Normal rate, regular rhythm and normal heart sounds.  Pulmonary/Chest: Effort normal and breath sounds normal.   Abdominal: Soft. Normal appearance and bowel sounds are normal. There is tenderness in the right upper quadrant, right lower quadrant and suprapubic area. There is no rigidity, no rebound, no guarding, no CVA tenderness, no tenderness at McBurney's point and negative Murphy's sign.   Neurological: She is alert and oriented to person, place, and time.   Skin: Skin is warm, dry and intact.   Psychiatric: She has a normal mood and affect. Her speech is normal and behavior is normal.     Ortho Exam  Neurologic Exam     Mental Status   Oriented to person, place, and time.   Speech: speech is normal       PROCEDURE(S)     Procedures        Signed,  Myer Haff, FNP  05/18/2017

## 2017-05-18 NOTE — Patient Instructions (Signed)
Ovarian Cysts  A cyst is usually a fluid-filled sac, like a small water balloon. Cysts are almost always harmless, and many go away on their own. Usually they grow slowly. They can vary in size from as small as a pea to larger than a grapefruit. Many cause no symptoms at all. Often they are felt only during a pelvic exam. Ovarian cysts are usually not cancer.    Functional cyst  A functional cyst is the most common kind of cyst. It forms when a follicle does not release a mature egg or continues to grow after releasing the egg. Functional cysts usually occur on onlyone ovary at a time. They usually shrink on their own in 1 to 3 months. In rare cases, a cyst will burst (rupture), causing pain. Pain might also be caused by the twisting of an ovary that is enlarged because of the cyst growing on it.    Dermoid cyst  Sometimescells that are present from birthwill start to grow into different kinds of tissue such as skin, fat, hair, and teeth. This kind of cyst is called a dermoid cyst. Dermoid cysts can grow onone or both ovaries. Usually they cause no symptoms. But if they leak orthe ovary becomestwisted, they can cause severe pain.    Endometrioma  Sometimes tissue similar to the lining of the uterus (endometrium) grows and becomes partof the ovary. This kind of cyst is often called a chocolate cyst because of its dark-brown color. These cysts can grow onone or both ovaries. They often cause pain, especially around menstruation or during sex.    Benign cystadenoma  If the capsule that surrounds the ovary grows, it can form a cystadenoma.These cysts can grow on one or both ovaries. Usually they cause no symptoms if they are small. But if they become large, they can press on organs near the ovaries, causing pain. They can also cause pain by stretching the ovarian capsule. A cyst that pushes on the bladder can cause frequent urination. Sometimes these cysts rupture and bleed.  Malignant cysts  These cysts  can invade other tissues or spread to other parts of the body.  Date Last Reviewed: 01/15/2016   2000-2018 [employer], [place]. [address], [place], [place] [address]. All rights reserved. This information is not intended as a substitute for professional medical care. Always follow your healthcare professional's instructions.        Abdominal Pain    Abdominal pain is pain in the stomach or belly area. Everyone has this pain from time to time. In many cases it goes away on its own. But abdominal pain can sometimes be due to a serious problem, such as appendicitis. So it's important to know when to seek help.  Causes of abdominal pain  There are many possible causes of abdominal pain. Common causes in adults include:   Constipation, diarrhea, or gas   Stomach acid flowing back up into the esophagus (acid reflux or heartburn)   Severe acid reflux, called GERD (gastroesophageal reflux disease)   A sore in the lining of the stomach or small intestine (peptic ulcer)   Inflammation of the gallbladder, liver,or pancreas   Gallstones or kidney stones   Appendicitis   Intestinal blockage   An internal organ pushing through a muscle or other tissue (hernia)   Urinary tract infections   In women, menstrual cramps, fibroids, or endometriosis   Inflammation or infection of the intestines  Diagnosing the cause of abdominal pain  Your healthcare provider  will do a physical exam help find the cause of your pain. If needed, tests will be ordered. Belly pain has many possible causes. So it can be hard to find the reason for your pain. Giving details about your pain can help. Tell your provider where and when you feel the pain, and what makes it better or worse. Also let your provider know if you have other symptoms such as:   Fever   Tiredness   Upset stomach (nausea)   Vomiting   Changes in bathroom habits  Treating abdominal pain  Some causes of pain need emergency medical treatment right away. These  include appendicitis or a bowel blockage. Other problems can be treated with rest, fluids, or medicines. Your healthcare provider can give you specific instructions for treatment or self-care based on what is causing your pain.  If you have vomiting or diarrhea,sip water or other clear fluids. When you are ready to eat solid foods again, start with small amounts of easy-to-digest, low-fat foods. These include apple sauce, toast, or crackers.   When to seek medical care  Call 911or go to the hospital right away if you:   Can't pass stool and are vomiting   Are vomiting blood or have bloody diarrhea or black, tarry diarrhea   Have chest, neck, or shoulder pain   Feel like you might pass out   Have pain in your shoulder blades with nausea   Have sudden, severe belly pain   Have new, severepain unlike any you have felt before   Have a belly that is rigid, hard, and tender to touch  Call your healthcare provider if you have:   Pain for more than5days   Bloating for more than 2days   Diarrhea for more than5days   A fever of 100.63F (38C) or higher, or as directed by your healthcare provider   Pain that gets worse   Weight loss for no reason   Continued lack of appetite   Blood in your stool  How to prevent abdominal pain  Here are some tips to help prevent abdominal pain:   Eat smaller amounts of food at one time.   Avoid greasy, fried, or other high-fat foods.   Avoid foods that give you gas.   Exercise regularly.   Drink plenty of fluids.  To help prevent GERD symptoms:   Quit smoking.   Reduce alcohol and certain foods that increase stomach acid.   Avoid aspirin and over-the-counter pain and fever medicines (NSAIDS or nonsteroidal anti-inflammatory drugs), if possible   Lose extra weight.   Finish eating at least 2 hours before you go to bed or lie down.   Raise the head of your bed.  Date Last Reviewed: 02/14/2015   2000-2018 [employer], [place]. [address],  [place], [place] [address]. All rights reserved. This information is not intended as a substitute for professional medical care. Always follow your healthcare professional's instructions.

## 2017-05-23 ENCOUNTER — Encounter (INDEPENDENT_AMBULATORY_CARE_PROVIDER_SITE_OTHER): Payer: Self-pay | Admitting: Family Medicine

## 2017-05-25 ENCOUNTER — Encounter (INDEPENDENT_AMBULATORY_CARE_PROVIDER_SITE_OTHER): Payer: Self-pay | Admitting: Family Medicine

## 2017-05-31 ENCOUNTER — Encounter: Payer: Self-pay | Admitting: Physician Assistant

## 2017-08-10 LAB — VITAMIN D 25 HYDROXY (VIT D DEFICIENCY, FRACTURES): VIT D 25 HYDROXY: 25

## 2017-08-10 LAB — VITAMIN B12: VITAMIN B 12: 442

## 2017-09-14 ENCOUNTER — Telehealth (INDEPENDENT_AMBULATORY_CARE_PROVIDER_SITE_OTHER): Payer: Self-pay | Admitting: Family Medicine

## 2017-09-14 DIAGNOSIS — F411 Generalized anxiety disorder: Secondary | ICD-10-CM

## 2017-09-14 NOTE — Telephone Encounter (Signed)
Name, strength, directions of requested refill(s):   VIIBRYD 40 MG Tab     strattera  60 mg   buPROPion XL (WELLBUTRIN XL) 150 MG 24 hr tablet  Pharmacy to send refill to or patient to pick up rx from office (mark requested pharmacy in [place]):  Patient stated she have not find new doctor for her condition please advise .    [id number] [place], [place] - [address] [address]  [phone] [place] [address]  Phone: [phone] Fax: [phone]    [facility] - [place], [place] - [address]  [address]  [place] [address]  Phone: [phone] Fax: [phone]    [facility] - [place], [place] - [address] AT [facility] [address]  [place] [address]  Phone: [phone] Fax: [phone]    [facility] [place], [place] - [address]  [address]  [place] [address]  Phone: [phone] Fax: [phone]        Please mark "X" next to the preferred call back number:    Mobile:   Telephone Information:   Mobile [phone]    x   Home: [phone]    Work:           Next visit: Visit date not found

## 2017-09-14 NOTE — Telephone Encounter (Signed)
Please review and advise.    Last office visit: 05/18/17 walk-in visit for abdominal pain.  Next appointment: nothing scheduled at this time.  Rx last sent: all medications listed in refill request were prescribed by another physician, outside of practice.

## 2017-09-15 MED ORDER — BUPROPION HCL ER (XL) 150 MG PO TB24
150.0000 mg | ORAL_TABLET | Freq: Every morning | ORAL | 0 refills | Status: AC
Start: 2017-09-15 — End: ?

## 2017-09-15 MED ORDER — VILAZODONE HCL 40 MG PO TABS
1.0000 | ORAL_TABLET | Freq: Every day | ORAL | 0 refills | Status: AC
Start: 2017-09-15 — End: ?

## 2017-09-15 MED ORDER — ATOMOXETINE HCL 60 MG PO CAPS
60.0000 mg | ORAL_CAPSULE | Freq: Every day | ORAL | 0 refills | Status: AC
Start: 2017-09-15 — End: ?

## 2017-09-15 NOTE — Telephone Encounter (Signed)
meds sent to new pharmacy.

## 2017-11-02 ENCOUNTER — Encounter: Payer: Self-pay | Admitting: Family Medicine

## 2017-11-10 ENCOUNTER — Ambulatory Visit (INDEPENDENT_AMBULATORY_CARE_PROVIDER_SITE_OTHER): Payer: Commercial Managed Care - PPO | Admitting: Family Medicine

## 2017-11-10 ENCOUNTER — Encounter: Payer: Self-pay | Admitting: Family Medicine

## 2017-11-10 VITALS — BP 134/82 | HR 107 | Temp 98.1°F | Ht 63.0 in | Wt 289.0 lb

## 2017-11-10 DIAGNOSIS — L508 Other urticaria: Secondary | ICD-10-CM

## 2017-11-10 DIAGNOSIS — K635 Polyp of colon: Secondary | ICD-10-CM | POA: Diagnosis not present

## 2017-11-10 DIAGNOSIS — L089 Local infection of the skin and subcutaneous tissue, unspecified: Secondary | ICD-10-CM

## 2017-11-10 DIAGNOSIS — F909 Attention-deficit hyperactivity disorder, unspecified type: Secondary | ICD-10-CM

## 2017-11-10 DIAGNOSIS — J309 Allergic rhinitis, unspecified: Secondary | ICD-10-CM

## 2017-11-10 DIAGNOSIS — Z8659 Personal history of other mental and behavioral disorders: Secondary | ICD-10-CM

## 2017-11-10 DIAGNOSIS — L739 Follicular disorder, unspecified: Secondary | ICD-10-CM

## 2017-11-10 DIAGNOSIS — E2740 Unspecified adrenocortical insufficiency: Secondary | ICD-10-CM

## 2017-11-10 DIAGNOSIS — E282 Polycystic ovarian syndrome: Secondary | ICD-10-CM

## 2017-11-10 DIAGNOSIS — Z114 Encounter for screening for human immunodeficiency virus [HIV]: Secondary | ICD-10-CM

## 2017-11-10 DIAGNOSIS — G43909 Migraine, unspecified, not intractable, without status migrainosus: Secondary | ICD-10-CM | POA: Insufficient documentation

## 2017-11-10 DIAGNOSIS — G43809 Other migraine, not intractable, without status migrainosus: Secondary | ICD-10-CM | POA: Diagnosis not present

## 2017-11-10 DIAGNOSIS — Z87892 Personal history of anaphylaxis: Secondary | ICD-10-CM | POA: Diagnosis not present

## 2017-11-10 DIAGNOSIS — Z13220 Encounter for screening for lipoid disorders: Secondary | ICD-10-CM

## 2017-11-10 LAB — COMPREHENSIVE METABOLIC PANEL
ALT: 11 U/L (ref 0–35)
AST: 13 U/L (ref 0–37)
Albumin: 4.1 g/dL (ref 3.5–5.2)
Alkaline Phosphatase: 74 U/L (ref 39–117)
BUN: 11 mg/dL (ref 6–23)
CO2: 28 meq/L (ref 19–32)
Calcium: 9.1 mg/dL (ref 8.4–10.5)
Chloride: 101 mEq/L (ref 96–112)
Creatinine, Ser: 0.83 mg/dL (ref 0.40–1.20)
GFR: 88.5 mL/min (ref >60.00)
GLUCOSE: 94 mg/dL (ref 70–99)
POTASSIUM: 4.1 meq/L (ref 3.5–5.1)
SODIUM: 138 meq/L (ref 135–145)
Total Bilirubin: 0.3 mg/dL (ref 0.2–1.2)
Total Protein: 7.3 g/dL (ref 6.0–8.3)

## 2017-11-10 LAB — CBC
HCT: 38.1 % (ref 36.0–46.0)
HEMOGLOBIN: 12.5 g/dL (ref 12.0–15.0)
MCHC: 32.8 g/dL (ref 30.0–36.0)
MCV: 76.5 fl — AB (ref 78.0–100.0)
PLATELETS: 358 10*3/uL (ref 150.0–400.0)
RBC: 4.98 Mil/uL (ref 3.87–5.11)
RDW: 15.8 % — AB (ref 11.5–15.5)
WBC: 8.3 10*3/uL (ref 4.0–10.5)

## 2017-11-10 LAB — TSH: TSH: 2.51 u[IU]/mL (ref 0.35–4.50)

## 2017-11-10 LAB — LDL CHOLESTEROL, DIRECT: LDL DIRECT: 131 mg/dL

## 2017-11-10 LAB — T3, FREE: T3, Free: 3.9 pg/mL (ref 2.3–4.2)

## 2017-11-10 LAB — CORTISOL: CORTISOL PLASMA: 9.7 ug/dL

## 2017-11-10 LAB — LIPID PANEL
CHOL/HDL RATIO: 4
Cholesterol: 221 mg/dL — ABNORMAL HIGH (ref 0–200)
HDL: 61 mg/dL (ref >39.00)
NONHDL: 160.28
Triglycerides: 268 mg/dL — ABNORMAL HIGH (ref 0.0–149.0)
VLDL: 53.6 mg/dL — AB (ref 0.0–40.0)

## 2017-11-10 LAB — T4, FREE: Free T4: 0.68 ng/dL (ref 0.60–1.60)

## 2017-11-10 NOTE — Assessment & Plan Note (Signed)
Continue Pepcid and Zyrtec.  Symptoms have likely improved recently due to being on chronic steroids.  Anticipate some worsening her symptoms as we wean off of her Cortef.

## 2017-11-10 NOTE — Patient Instructions (Signed)
It was very nice to meet you today.  We will check blood work today.  I will also place a referral to the endocrinologist and gastroenterologist.  Come back to see me in 4-6 weeks, or sooner as needed.  Take care, Dr Jimmey RalphParker

## 2017-11-10 NOTE — Assessment & Plan Note (Signed)
Stable.  PHQ 2-.  Defer further management to psychiatry.

## 2017-11-10 NOTE — Assessment & Plan Note (Signed)
Stable.  Continue Flonase as needed. 

## 2017-11-10 NOTE — Assessment & Plan Note (Signed)
No red flag signs or symptoms.  Discussed avoidance of Fioricet and was has severe headaches.  If headaches become more frequent, would consider trial of controller medication.  Consider trial of triptan in the future.

## 2017-11-10 NOTE — Assessment & Plan Note (Signed)
Continue OCP and metformin.  Check testosterone and DHEA sulfate level today.  Consider weaning off or stopping both these medications pending records from her previous PCP.

## 2017-11-10 NOTE — Progress Notes (Signed)
Subjective:  Kaitlin Robles is a 27 y.o. female who presents today with a chief complaint of adrenal insufficiency and to establish care.   HPI:  Patient moved to [place] from the [place] area about 5 months ago.  She moved to the area for work.  Adrenal insufficiency, chronic problem, new to this provider Patient d ADHD iagnosed several years ago by a functional medicine doctor.  Was told that she had low cortisol for several years.  She currently takes hydrocortisone 10 mg 2 or 3 times daily.  She occasionally misses a dose, however does not notice any change in her symptoms after missing a dose.  Skin infection, new problem Patient diagnosed with a skin infection a few weeks ago.  Noticed a swollen lymph node behind her right ear.  She was started on a course of doxycycline which is significantly improved her symptoms.  No fevers or chills.  Skin lesion, new problem Located on right lower leg.  Present for the last 2 years.  Thinks that it may have started out as an ingrown hair that is not healed.  No significant change over the past several months to years.  No pain.  No drainage.  ADHD, chronic problem On strattera. Sees psychiatry for this.   Depression, chronic problem On wellbutrin. Sees psychiatry for this.   Chronic urticaria, chronic problem, new to this provider.  Several year history.  Currently on Pepcid and Zyrtec.  She has not had a flareup of her symptoms for several years.  Allergic rhinitis, chronic problem, new to this provider.  Takes flonase as needed as well as zyrtec. Symptoms are stable.  History of colon polyps, chronic problem, new to this provider.  Patient reports history of colon polyps that was found by her prior gastroenterologist in [place]. Requests referral to establish care with a local GI doctor.   Migraines, chronic problem, new to this provider Takes fioricet as needed once every 3 weeks. No current headaches.   PCOS, chronic  problem, new to this provider Several year history. Diagnosed by her previous physician. Currently on OCP and metformin. She is not sure if this is an accurate diagnosis.  ROS: Per HPI, otherwise a complete review of systems was negative.   PMH:  The following were reviewed and entered/updated in epic: Past Medical History:  Diagnosis Date  . Adrenal insufficiency (Addison's disease) (HCC)   . Chronic UTI   . Colon polyps   . Depression   . Head ache   . Hyperlipidemia   . Lichen sclerosus of female genitalia   . Malabsorption   . Migraine headache   . Urticaria    Patient Active Problem List   Diagnosis Date Noted  . Adrenal insufficiency (HCC) 11/10/2017  . Polyp of colon 11/10/2017  . Chronic urticaria 11/10/2017  . Migraine 11/10/2017  . PCOS (polycystic ovarian syndrome) 11/10/2017  . History of anaphylaxis 11/10/2017  . Attention deficit hyperactivity disorder (ADHD) 11/10/2017  . History of depression 11/10/2017  . Allergic rhinitis 11/10/2017   Past Surgical History:  Procedure Laterality Date  . CHOLECYSTECTOMY  2013  . KIDNEY STONE SURGERY      Family History  Problem Relation Age of Onset  . Diabetes Mother   . Hyperlipidemia Mother   . Hypertension Mother   . Miscarriages / [place] Mother   . Hyperlipidemia Daughter   . COPD Maternal Grandfather   . Hypertension Maternal Grandfather   . Heart disease Maternal Grandfather   .  Hypertension Paternal Grandfather   . Heart attack Paternal Grandfather     Medications- reviewed and updated Current Outpatient Medications  Medication Sig Dispense Refill  . atomoxetine (STRATTERA) 60 MG capsule     . BLISOVI FE 1/20 1-20 MG-MCG tablet     . buPROPion (WELLBUTRIN XL) 150 MG 24 hr tablet     . butalbital-acetaminophen-caffeine (FIORICET, ESGIC) 50-325-40 MG tablet Take by mouth 2 (two) times daily as needed for headache.    . cetirizine (ZYRTEC) 10 MG tablet Take 10 mg by mouth daily.    Marland Kitchen doxycycline  (VIBRAMYCIN) 50 MG/5ML SYRP Take by mouth 2 (two) times daily.    Marland Kitchen EPINEPHrine 0.3 mg/0.3 mL IJ SOAJ injection Inject 0.3 mg into the muscle once.    . famotidine (PEPCID) 20 MG tablet Take 20 mg by mouth 2 (two) times daily.    . fluticasone (FLONASE) 50 MCG/ACT nasal spray Place into both nostrils daily.    . hydrocortisone (CORTEF) 10 MG tablet     . magnesium 30 MG tablet Take 30 mg by mouth 2 (two) times daily.    . metFORMIN (GLUCOPHAGE-XR) 500 MG 24 hr tablet     . nystatin (MYCOSTATIN) 500000 units TABS tablet      No current facility-administered medications for this visit.     Allergies-reviewed and updated Allergies  Allergen Reactions  . Banana Nausea Only  . Ceclor [Cefaclor] Hives  . Venomil Wasp Venom [Wasp Venom] Rash    Social History   Socioeconomic History  . Marital status: Single    Spouse name: Not on file  . Number of children: Not on file  . Years of education: Not on file  . Highest education level: Not on file  Occupational History  . Not on file  Social Needs  . Financial resource strain: Not on file  . Food insecurity:    Worry: Not on file    Inability: Not on file  . Transportation needs:    Medical: Not on file    Non-medical: Not on file  Tobacco Use  . Smoking status: Never Smoker  . Smokeless tobacco: Never Used  Substance and Sexual Activity  . Alcohol use: Never    Frequency: Never  . Drug use: Never  . Sexual activity: Not Currently  Lifestyle  . Physical activity:    Days per week: Not on file    Minutes per session: Not on file  . Stress: Not on file  Relationships  . Social connections:    Talks on phone: Not on file    Gets together: Not on file    Attends religious service: Not on file    Active member of club or organization: Not on file    Attends meetings of clubs or organizations: Not on file    Relationship status: Not on file  Other Topics Concern  . Not on file  Social History Narrative  . Not on file    Objective:  Physical Exam: BP 134/82 (BP Location: Left Arm)   Pulse (!) 107   Temp 98.1 F (36.7 C)   Ht 5\' 3"  (1.6 m)   Wt 289 lb (131.1 kg)   SpO2 98%   BMI 51.19 kg/m   Gen: NAD, resting comfortably HEENT: No LAD. CV: RRR with no murmurs appreciated Pulm: NWOB, CTAB with no crackles, wheezes, or rhonchi GI: Morbidly obese, Normal bowel sounds present. Soft, Nontender, Nondistended. MSK: No edema, cyanosis, or clubbing noted Skin: Hyperpigmented nodular lesion on  right anterior shin.  Lesion is also erythematous, however blanchable Neuro: Grossly normal, moves all extremities Psych: Normal affect and thought content  Assessment/Plan:  Adrenal insufficiency (HCC) No signs of adrenal crisis.  Continue current dose of Cortef.  We will obtain records from her previous PCP.  Patient would like to wean off or stop Cortef completely.  We will refer to endocrinology to hopefully help with this.  Check cortisol level, CMET, and TSH.  Polyp of colon Will refer to gastroenterology to establish care in the area.  Will obtain records from previous PCP.  Attention deficit hyperactivity disorder (ADHD) Stable.  Defer further management to psychiatry.  History of depression Stable.  PHQ 2-.  Defer further management to psychiatry.  Chronic urticaria Continue Pepcid and Zyrtec.  Symptoms have likely improved recently due to being on chronic steroids.  Anticipate some worsening her symptoms as we wean off of her Cortef.  Allergic rhinitis Stable.  Continue Flonase as needed.  Migraine No red flag signs or symptoms.  Discussed avoidance of Fioricet and was has severe headaches.  If headaches become more frequent, would consider trial of controller medication.  Consider trial of triptan in the future.  PCOS (polycystic ovarian syndrome) Continue OCP and metformin.  Check testosterone and DHEA sulfate level today.  Consider weaning off or stopping both these medications pending records  from her previous PCP.  Skin infection Symptoms resolved.  Continue with watchful waiting.  Folliculitis Lesion on shin consistent with a folliculitis.  We will continue with watchful waiting.  Continue biopsy if does not resolve within the next few weeks to months or if changes characteristic.  Preventative healthcare Obtain records from previous PCP.  Check lipid panel and HIV antibody today.  Katina Degreealeb M. Jimmey RalphParker, MD 11/10/2017 12:05 PM

## 2017-11-10 NOTE — Assessment & Plan Note (Signed)
Stable.  Defer further management to psychiatry.

## 2017-11-10 NOTE — Assessment & Plan Note (Signed)
No signs of adrenal crisis.  Continue current dose of Cortef.  We will obtain records from her previous PCP.  Patient would like to wean off or stop Cortef completely.  We will refer to endocrinology to hopefully help with this.  Check cortisol level, CMET, and TSH.

## 2017-11-10 NOTE — Assessment & Plan Note (Signed)
Will refer to gastroenterology to establish care in the area.  Will obtain records from previous PCP.

## 2017-11-11 LAB — HIV ANTIBODY (ROUTINE TESTING W REFLEX): HIV 1&2 Ab, 4th Generation: NONREACTIVE

## 2017-11-11 LAB — DHEA-SULFATE: DHEA-SO4: 89 ug/dL (ref 18–391)

## 2017-11-11 LAB — PROLACTIN: Prolactin: 15.5 ng/mL

## 2017-11-13 LAB — TESTOS,TOTAL,FREE AND SHBG (FEMALE)
FREE TESTOSTERONE: 2 pg/mL (ref 0.1–6.4)
SEX HORMONE BINDING: 107 nmol/L (ref 17–124)
TESTOSTERONE, TOTAL, LC-MS-MS: 30 ng/dL (ref 2–45)

## 2017-11-30 ENCOUNTER — Encounter: Payer: Self-pay | Admitting: Family Medicine

## 2017-11-30 ENCOUNTER — Ambulatory Visit (INDEPENDENT_AMBULATORY_CARE_PROVIDER_SITE_OTHER): Payer: Commercial Managed Care - PPO | Admitting: Family Medicine

## 2017-11-30 DIAGNOSIS — G43809 Other migraine, not intractable, without status migrainosus: Secondary | ICD-10-CM

## 2017-11-30 DIAGNOSIS — E2740 Unspecified adrenocortical insufficiency: Secondary | ICD-10-CM | POA: Diagnosis not present

## 2017-11-30 DIAGNOSIS — L309 Dermatitis, unspecified: Secondary | ICD-10-CM | POA: Diagnosis not present

## 2017-11-30 DIAGNOSIS — E559 Vitamin D deficiency, unspecified: Secondary | ICD-10-CM | POA: Diagnosis not present

## 2017-11-30 MED ORDER — PROMETHAZINE HCL 12.5 MG PO TABS: 12.5000 mg | ORAL_TABLET | Freq: Four times a day (QID) | ORAL | 0 refills | Status: DC | PRN

## 2017-11-30 MED ORDER — TRIAMCINOLONE ACETONIDE 0.5 % EX OINT: 1.0000 "application " | TOPICAL_OINTMENT | Freq: Two times a day (BID) | CUTANEOUS | 0 refills | Status: AC

## 2017-11-30 NOTE — Assessment & Plan Note (Signed)
Patient most recent cortisol levels that normal ranges.  I have reviewed records from her previous PCP and I am not convinced that she actually has adrenal insufficiency.  Advised patient to stay on current dose of Cortef at 5 mg daily.  She has an appointment with endocrinology in the next 2 weeks.  Will defer further management to them.  Hopefully will be able to wean off her Cortef completely.

## 2017-11-30 NOTE — Assessment & Plan Note (Signed)
This is worsened as she has weaned off the Cortef.  We will start topical triamcinolone for areas of inflammation.

## 2017-11-30 NOTE — Patient Instructions (Signed)
Please start the triamcinolone.  Use the phenergan as needed.  Let me know if you want to start a prescription strength vitamin D level.  Please keep your same dose of cortef for the time being.  Come back to see me in 3 months, or sooner as needed.  Take care, Dr Lynnell DikeParkre

## 2017-11-30 NOTE — Progress Notes (Signed)
    Subjective:  Kaitlin Robles is a 27 y.o. female who presents today with a chief complaint of adrenal insufficiency follow up.   HPI:  History of Adrenal Insufficiency, established problem, stable Patient initially seen about 3 months ago for her initial patient visit.  At that time she was taken hydrocortisone 20 mg daily due to being diagnosed as having adrenal insufficiency by a functional medicine doctor in [place].  Since our visit, she has weaned down her dose of hydrocortisone.  She is now currently only taking 5 mg daily.  She initially had several symptoms including headache that has since subsided.  Symptoms seem to be relatively stable at this point.  She has endocrinology appointment next month.  Eczema, chronic problem, new to this provider Patient also with intermittent history of eczema.  She has not had any flareup for the past several years, however over the past couple weeks has noticed worsening symptoms on her arms, legs, and back.  She usually uses clobetasol which helps for this.  Rash is very itchy.  Vitamin D Deficiency, chronic problem, new to this provider Patient with several year history of vitamin D deficiency. Had lab work done recently which showed a level of 21. She has been on replacement via her functional docot in the past, but does not recall the exact dose. She was taking 10 drops of a tincture.   Migraines, chronic problem, worsening These have also worsened as she is weaned off Cortef.  Associated with significant amount of nausea and vomiting.  No current headache.  ROS: Per HPI  PMH: She reports that she has never smoked. She has never used smokeless tobacco. She reports that she does not drink alcohol or use drugs.    Objective:  Physical Exam: BP 138/86 (BP Location: Left Arm)   Pulse (!) 112   Temp 98.8 F (37.1 C) (Oral)   Resp 20   Ht 5\' 3"  (1.6 m)   Wt 289 lb (131.1 kg)   LMP 11/18/2017   SpO2 97%   BMI 51.19 kg/m   Gen: NAD, resting  comfortably CV: RRR with no murmurs appreciated Pulm: NWOB, CTAB with no crackles, wheezes, or rhonchi Skin: Eczematous rash on LE.  Assessment/Plan:  Adrenal insufficiency (HCC) Patient most recent cortisol levels that normal ranges.  I have reviewed records from her previous PCP and I am not convinced that she actually has adrenal insufficiency.  Advised patient to stay on current dose of Cortef at 5 mg daily.  She has an appointment with endocrinology in the next 2 weeks.  Will defer further management to them.  Hopefully will be able to wean off her Cortef completely.  Eczema This is worsened as she has weaned off the Cortef.  We will start topical triamcinolone for areas of inflammation.  Vitamin D deficiency Last value of 21.  Discussed replacement options, however patient deferred.  We can recheck again in 3-6 months.  Migraine No red flag signs or symptoms.  We will send in prescription to [facility] to help her with her nausea associated with the migraines.  Discussed appropriate use of this as well as her Fioricet.  May consider trial of triptan in the future.  Katina Degreealeb M. Jimmey RalphParker, MD 11/30/2017 12:07 PM

## 2017-11-30 NOTE — Assessment & Plan Note (Signed)
No red flag signs or symptoms.  We will send in prescription to [facility] to help her with her nausea associated with the migraines.  Discussed appropriate use of this as well as her Fioricet.  May consider trial of triptan in the future.

## 2017-11-30 NOTE — Assessment & Plan Note (Signed)
Last value of 21.  Discussed replacement options, however patient deferred.  We can recheck again in 3-6 months.

## 2017-12-07 ENCOUNTER — Ambulatory Visit: Payer: Commercial Managed Care - PPO | Admitting: Family Medicine

## 2017-12-21 ENCOUNTER — Other Ambulatory Visit: Payer: Self-pay | Admitting: Family Medicine

## 2017-12-21 ENCOUNTER — Telehealth: Payer: Self-pay | Admitting: Family Medicine

## 2017-12-21 NOTE — Telephone Encounter (Signed)
See note.   Copied from CRM [id number]. Topic: General - Other >> Dec 21, 2017 12:06 PM Trula Slade wrote: Reason for CRM:   Patient would like a return call to discuss the Hydrocodone tablets that were prescribed by her previous doctor before she moved to the area.  Patient will be available before 1pm and after 4:30pm.  She will be going through withdrawal symptoms.

## 2017-12-21 NOTE — Telephone Encounter (Signed)
Copied from CRM [id number]. Topic: Quick Communication - Rx Refill/Question >> Dec 21, 2017  2:19 PM Raquel Sarna wrote: hydrocortisone (CORTEF) 10 MG tablet  Pt is needing refill - previous request was the wrong Rx. Needing this one filled.  [facility] [place], [place] - [address] [place] [address] Phone: [phone] Fax: [phone]

## 2017-12-21 NOTE — Telephone Encounter (Signed)
LVMOM per Dr. Artis Flock : Oxycodone is not on her med list. CMP is not the prescribing the doctor for the Oxycodone. Pt should call her doctor that prescribed the med or go to the Emergency Dept. The on-call doctor cannot prescribe narcotics.

## 2017-12-22 ENCOUNTER — Other Ambulatory Visit: Payer: Self-pay

## 2017-12-22 MED ORDER — HYDROCORTISONE 10 MG PO TABS: 10.0000 mg | ORAL_TABLET | Freq: Every day | ORAL | 0 refills | Status: DC

## 2017-12-22 NOTE — Telephone Encounter (Signed)
See note

## 2017-12-22 NOTE — Telephone Encounter (Signed)
Please advise 

## 2017-12-22 NOTE — Telephone Encounter (Signed)
Patient is requesting a refill of medication: Cortef 10 mg ( check dosing)  See :OV 11/30/17 patient was to follow up with her endocrine for management and discontinue of medication.

## 2017-12-22 NOTE — Telephone Encounter (Signed)
Do not see any mention of hydrocodone in her medical history. She will need to schedule an appointment with me to discuss further.   Kaitlin Robles. Jimmey Ralph, MD 12/22/2017 8:05 AM

## 2017-12-22 NOTE — Telephone Encounter (Signed)
Rx sent in. Sent in enough until pt can see endo.  Kaitlin Robles. Jimmey Ralph, MD 12/22/2017 3:15 PM

## 2018-01-03 ENCOUNTER — Encounter: Payer: Self-pay | Admitting: Internal Medicine

## 2018-01-03 ENCOUNTER — Ambulatory Visit (INDEPENDENT_AMBULATORY_CARE_PROVIDER_SITE_OTHER): Payer: Commercial Managed Care - PPO | Admitting: Internal Medicine

## 2018-01-03 VITALS — BP 136/102 | HR 115 | Ht 63.0 in | Wt 287.8 lb

## 2018-01-03 DIAGNOSIS — E2740 Unspecified adrenocortical insufficiency: Secondary | ICD-10-CM | POA: Diagnosis not present

## 2018-01-03 DIAGNOSIS — E282 Polycystic ovarian syndrome: Secondary | ICD-10-CM

## 2018-01-03 NOTE — Patient Instructions (Addendum)
Please try to stop: - Metformin - Junel   Please come back at 8 am for a cosyntropin stimulation test. Please plan to be here for an hour.  Continue Hydrocortisone 5 mg in am.  Please consider: - You absolutely need to take this medication every day and not skip doses. - Please double the dose if you have a fever, for the duration of the fever. - If you cannot take anything by mouth (vomiting) or you have severe diarrhea so that you eliminate the hydrocortisone pills in your stool, please make sure that you get the hydrocortisone in the vein instead - go to the nearest emergency department/urgent care or you may go to your PCPs office  - Please try to get a MedAlert bracelet or pendant indicating: "Adrenal insufficiency".  Please come back for a follow-up appointment in 6 months.

## 2018-01-03 NOTE — Progress Notes (Signed)
Patient ID: Kaitlin Robles, female   DOB: 11/02/1990, 27 y.o.   MRN: [id number]    HPI  Kaitlin Robles is a 27 y.o.-year-old female, referred by her PCP, Dr. Jimmey Ralph, for evaluation and management of adrenal insufficiency. She moved to [place] in 05/2017.  Patient describes that she has a history of many courses of prednisone for anaphylaxis to fire aunts bites in 2014, when she was living in [place], after which she started to see endocrinology for severe fatigue (she could not keep a full-time job), weight gain of 80 pounds, and also irregular menstrual cycles.   She was diagnosed with PCOS and started on metformin. She does not feel that this is helping.  However, at that time, as she felt that she was not getting all her answers from endocrinology, she started to see integrative medicine.  Pt. has been found to have a low cortisol level in 2015.  This was only followed initially but she had another low cortisol level in 02/2017 (see scanned results below).  At that time,  she was started on hydrocortisone 20 mg a day (10 + 5 + 5 - she was usually forgetting the last dose of the day).  She was on this dose for approximately 1.5 years, after which she started to decrease by herself. At last visit with PCP she was taking 5 mg a day (started 10/2017)  -she remembers that she had flulike symptoms, fatigue, rash for 3 days,  but this resolved.  She is now feeling well, without significant fatigue.  She is interested in coming off hydrocortisone.  She also describes that she is exercising consistently (starting 05/2017).  She gained almost 80 pounds after her steroid treatments and she is trying to lose weight. She actually lost weight in college, when she was cooking all meals, not eating processed foods.  Regarding her PCOS, she describes having occasional irregular menstrual cycles in the past (but definitely more than 4 cycles a year), but she has been on OCPs (Junel) since diagnosis of PCOS, in 2014.  She is  interested in coming off this medication also.  She had 3 ruptured cysts in last year - last in 05/2017.  She is also on metformin ER 500 mg twice a day but she does not think that this is helping anything.  She is also interested in stopping this medication. No OCP needed for protection - she is in a lesbian relationship. She does not have increased acne or hirsutism.  No h/o Depo-provera, Megace, po ketoconazole, phenytoin, rifampin, chronic fluconazole use. No h/o autoimmune diseases in pt. No excess use of NSAIDs. No h/o generalized infections or HIV. No IVDA. No h/o head injury or severe HA. No h/o malignancy. Had a precancerous colon polyp removed. Now q5 years colonoscopy.  Of note,  She has a history of SIBO (diagnosed around 2015), which is now almost resolved.  Pt mentions: - no weight loss - + fatigue - + nausea - with HAs - no vomiting - no abdominal pain (only occas. With eating 2/2 SIBO) or back pain - + mm aches - no palpitations - + HAs - no dizziness - no syncopal episodes   I reviewed pt's cortisol levels from 02/28/2017 (ordered by Dr. Leafy Half) - [facility]:   A more recent a.m. cortisol was normal: Component     Latest Ref Rng & Units 11/10/2017  Cortisol, Plasma     ug/dL 9.7   Her DHEAS was normal at that time, at 89.  She also had normal TSH, free T4, free T3, prolactin, testosterone  Lab Results  Component Value Date   TSH 2.51 11/10/2017   Lab Results  Component Value Date   FREET4 0.68 11/10/2017   T3FREE 3.9 11/10/2017   Lab Results  Component Value Date   PROLACTIN 15.5 11/10/2017   Component     Latest Ref Rng & Units 11/10/2017  Testosterone, Total, LC-MS-MS     2 - 45 ng/dL 30  Free Testosterone     0.1 - 6.4 pg/mL 2.0  Sex Horm Binding Glob, Serum     17 - 124 nmol/L 107   No h/o hyponatremia or hyperkalemia:   Chemistry      Component Value Date/Time   NA 138 11/10/2017 0917   NA 141 02/22/2017   K 4.1 11/10/2017 0917    CL 101 11/10/2017 0917   CO2 28 11/10/2017 0917   BUN 11 11/10/2017 0917   BUN 8 02/22/2017   CREATININE 0.83 11/10/2017 0917   GLU 83 04/14/2017      Component Value Date/Time   CALCIUM 9.1 11/10/2017 0917   ALKPHOS 74 11/10/2017 0917   AST 13 11/10/2017 0917   ALT 11 11/10/2017 0917   BILITOT 0.3 11/10/2017 0917     She also has a history of hypertriglyceridemia, chronic urticaria, lichen sclerosus, ADHD.  She had kidney stone removal in 2013.   ROS: Constitutional: + See HPI Eyes: no blurry vision, no xerophthalmia ENT: no sore throat, no nodules palpated in throat, no dysphagia/odynophagia, no hoarseness Cardiovascular: no CP/SOB/palpitations/leg swelling Respiratory: no cough/SOB Gastrointestinal: + N/no V/+ D/no C Musculoskeletal: + Both: Muscle/joint aches Skin: + Rash on face, especially with hydrocortisone, + new stretch marks Neurological: no tremors/numbness/tingling/dizziness, + headache Psychiatric: + depression/no anxiety  Past Medical History:  Diagnosis Date  . Adrenal insufficiency (Addison's disease) (HCC)   . Chronic UTI   . Colon polyps   . Depression   . Head ache   . Hyperlipidemia   . Lichen sclerosus of female genitalia   . Malabsorption   . Migraine headache   . Urticaria    Past Surgical History:  Procedure Laterality Date  . CHOLECYSTECTOMY  2013  . KIDNEY STONE SURGERY     Social History   Socioeconomic History  . Marital status: Single    Spouse name: Not on file  . Number of children: 0  . Years of education: Not on file  . Highest education level: Not on file  Occupational History  .  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 Financial resource strain: Not on file  . Food insecurity:    Worry: Not on file    Inability: Not on file  . Transportation needs:    Medical: Not on file    Non-medical: Not on file  Tobacco Use  . Smoking status: Never Smoker  . Smokeless tobacco: Never Used  Substance and Sexual  Activity  . Alcohol use: Never    Frequency: Never  . Drug use: Never   Current Outpatient Medications on File Prior to Visit  Medication Sig Dispense Refill  . atomoxetine (STRATTERA) 60 MG capsule     . BLISOVI FE 1/20 1-20 MG-MCG tablet     . buPROPion (WELLBUTRIN XL) 150 MG 24 hr tablet     . butalbital-acetaminophen-caffeine (FIORICET, ESGIC) 50-325-40 MG tablet Take by mouth 2 (two) times daily as needed for headache.    . cetirizine (ZYRTEC) 10 MG tablet Take 10  mg by mouth daily.    Marland Kitchen EPINEPHrine 0.3 mg/0.3 mL IJ SOAJ injection Inject 0.3 mg into the muscle once.    . famotidine (PEPCID) 20 MG tablet Take 20 mg by mouth 2 (two) times daily.    . fluticasone (FLONASE) 50 MCG/ACT nasal spray Place into both nostrils daily.    . hydrocortisone (CORTEF) 10 MG tablet Take 1 tablet (10 mg total) by mouth daily. 30 tablet 0  . metFORMIN (GLUCOPHAGE-XR) 500 MG 24 hr tablet 2 (two) times daily.     Marland Kitchen nystatin (MYCOSTATIN) 500000 units TABS tablet     . promethazine (PHENERGAN) 12.5 MG tablet Take 1 tablet (12.5 mg total) by mouth every 6 (six) hours as needed for nausea or vomiting. 30 tablet 0  . triamcinolone ointment (KENALOG) 0.5 % Apply 1 application topically 2 (two) times daily. 30 g 0  . Vilazodone HCl (VIIBRYD) 40 MG TABS Take 40 mg by mouth daily.     No current facility-administered medications on file prior to visit.    Allergies  Allergen Reactions  . Banana Nausea Only  . Ceclor [Cefaclor] Hives  . Venomil Wasp Venom [Wasp Venom] Anaphylaxis   Family History  Problem Relation Age of Onset  . Diabetes Mother   . Hyperlipidemia Mother   . Hypertension Mother   . Miscarriages / [place] Mother   . Hyperlipidemia Daughter   . COPD Maternal Grandfather   . Hypertension Maternal Grandfather   . Heart disease Maternal Grandfather   . Hypertension Paternal Grandfather   . Heart attack Paternal Grandfather     PE: BP (!) 136/102   Pulse (!) 115   Ht  (1.6 m)    Wt 287 lb 12.8 oz (130.5 kg)   BMI 50.98 kg/m  Wt Readings from Last 3 Encounters:  01/03/18 287 lb 12.8 oz (130.5 kg)  11/30/17 289 lb (131.1 kg)  11/10/17 289 lb (131.1 kg)   Constitutional: obese, in NAD Eyes: PERRLA, EOMI, no exophthalmos ENT: moist mucous membranes, no thyromegaly, no cervical lymphadenopathy Cardiovascular: tachycardia, RR, No MRG Respiratory: CTA B Gastrointestinal: abdomen soft, NT, ND, BS+ Musculoskeletal: no deformities, strength intact in all 4 Skin: moist, warm, no rashes; no dark discoloration of skin Neurological: no tremor with outstretched hands, DTR normal in all 4  ASSESSMENT: 1. Adrenal insufficiency  2. PCOS  PLAN:  1. Adrenal insufficiency (AI) - Patient was diagnosed with adrenal insufficiency on the basis of salivary cortisol results after a year in which she had prednisone courses for anaphylaxis to fire ants stings.  It is conceivable that her adrenal cortisol production was low at that time due to inhibitor effect of exogenous prednisone on the hypothalamus.  I explained that this is usually transient, and she would have been needed to be tested after few months to see if her hypothalamic-pituitary-adrenal axis recovered.  Ideally, this is done by an insulin tolerance test or at least a cosyntropin stimulation test. - After approximately 1.5 years on 15 to 20 mg of hydrocortisone daily, she took it upon herself to decrease her hydrocortisone dose down to 5 mg daily.  She had 3 days of flulike symptoms and feeling poorly, after which, she started to feel well, and currently she has no complaints on this dose.  She has been on it for approximately 2 months. - We discussed that her adrenal insufficiency most likely resolved, but we need to make sure, so I would like her to come back for a cosyntropin stimulation test  and then I will advise her whether she can stop the hydrocortisone or not. The dose that she is taking now is so low, that we could  possibly to stop it but I would prefer to wait for a few days until I get the results of her stimulation test before we do so.  If we do end up stopping, I advised her to keep hydrocortisone tablets at hand, just in case, as she may require some hydrocortisone in stress situations.  However, I do not expect that she will need this daily.   - We did discuss about sick day rules, however,  since we are trying to take her off hydrocortisone, I do not think that she will need these long term:  If you cannot keep anything down, including your hydrocortisone medication, please go to the emergency room or your primary care physician office to get steroids injected in the muscle or vein. Alternatively, you can inject 100 mg hydrocortisone in the muscle at home.  If you have a fever (more than 100 Fahrenheit) or gastroenteritis with nausea/vomiting and diarrhea, please double the dose of your hydrocortisone for the duration of the fever or the gastroenteritis. - I will be in touch with her about the results, and we will see her back in 6 months  2. PCOS - Patient was diagnosed with PCOS in 2014 due to menstrual irregularity.  I do not have the results of this investigation. - She does have weight gain, ovarian cysts, and, likely, insulin resistance.  She is telling me that she had an insulin level checked by his integrative medicine physician and this was elevated.  She was started on metformin, but she does not feel that this is helping much.  We discussed that this may help her lose weight but she did not notice a significant weight loss on it.  Since that she is interested in coming off the metformin, I advised her that  we can do that and see how she feels.  We can always restart it later, if she prefers. - Also, she is interested in coming off her OCPs.  She has been on them for 4 years.  She does not need them for protection and she does not have acne and hirsutism, so they would only be used for menstrual  regulation.  However, before she was started on OCPs, she was already having more than 4 cycles a year, so at this point, I advised her that she can stop them and we can recheck her testosterone level when she comes back in 6 months. - We discussed about the importance of losing weight and she already started to try to do so: She started to exercise.  She was able to lose a significant amount of weight in college, while eating very healthy.  It would be ideal if she could return to that diet. - We reviewed together the results of her DHEAS and testosterone levels and these were normal, on OCPs  Orders Placed This Encounter  Procedures  . ACTH  . Cortisol  . Cortisol  . Cortisol   Component     Latest Ref Rng & Units 01/10/2018 01/10/2018 01/10/2018         8:17 AM  8:55 AM  9:23 AM  Cortisol, Plasma     ug/dL 16.1 09.6 04.5   Robust adrenal response to cosyntropin >> will stop HC.   Carlus Pavlov, MD PhD [facility]

## 2018-01-06 ENCOUNTER — Telehealth: Payer: Self-pay | Admitting: Family Medicine

## 2018-01-06 NOTE — Telephone Encounter (Signed)
Pt called back to advise she was able to get in touch with the prescribing md, so pt does NOT need the refill.

## 2018-01-06 NOTE — Telephone Encounter (Signed)
Please advise 

## 2018-01-06 NOTE — Telephone Encounter (Signed)
Noted  

## 2018-01-06 NOTE — Telephone Encounter (Signed)
Pt also need a few day refill on Vilazodone HCl (VIIBRYD) 40 MG TABS  And atomoxetine (STRATTERA) 60 MG capsule

## 2018-01-06 NOTE — Telephone Encounter (Signed)
Patient is requesting short term refills on her medication- the entire office is closed on Friday- patient was unaware.  She is new to using mail order and when she checked on her order - patient was informed that they had needed clarification of Rx and patient had not been notified. The pharmacy apologized for the mix up- but patient is now out of her medication. Patient really needs the Wellbutrin more than anything. Please let her know if a short term Rx can be called in until next week when her doctor reopens. Please contact patient either way- she may need to go to [facility]- she maybe called at [phone].

## 2018-01-06 NOTE — Telephone Encounter (Signed)
Copied from CRM [id number]. Topic: Quick Communication - Rx Refill/Question >> Jan 06, 2018  9:08 AM Gloriann Loan L wrote: Medication: buPROPion (WELLBUTRIN XL) 150 MG 24 hr tablet    short term use for a week her psychiatry office is closed   Has the patient contacted their pharmacy? No never received medicine here    (Agent: If no, request that the patient contact the pharmacy for the refill.) (Agent: If yes, when and what did the pharmacy advise?)  Preferred Pharmacy (with phone number or street name): [facility] [phone] - [place], [place] - [address] AT [facility] [address] [phone] (Phone) [phone] (Fax)      Agent: Please be advised that RX refills may take up to 3 business days. We ask that you follow-up with your pharmacy.

## 2018-01-06 NOTE — Telephone Encounter (Signed)
Looks like she got her meds - no further action needed.  Kaitlin Robles. Jimmey Ralph, MD 01/06/2018 10:56 AM

## 2018-01-10 ENCOUNTER — Other Ambulatory Visit (INDEPENDENT_AMBULATORY_CARE_PROVIDER_SITE_OTHER): Payer: Commercial Managed Care - PPO

## 2018-01-10 DIAGNOSIS — E2740 Unspecified adrenocortical insufficiency: Secondary | ICD-10-CM

## 2018-01-10 LAB — CORTISOL
CORTISOL PLASMA: 30 ug/dL
Cortisol, Plasma: 12.2 ug/dL
Cortisol, Plasma: 25.5 ug/dL

## 2018-01-10 MED ORDER — COSYNTROPIN NICU IV SYRINGE 0.25 MG/ML (STANDARD DOSE)
0.2500 mg | Freq: Once | INTRAVENOUS | Status: AC
  Administered 2018-01-10: 0.25 mg via INTRAMUSCULAR

## 2018-01-11 ENCOUNTER — Encounter (INDEPENDENT_AMBULATORY_CARE_PROVIDER_SITE_OTHER): Payer: Self-pay | Admitting: Family Medicine

## 2018-01-11 ENCOUNTER — Encounter: Payer: Self-pay | Admitting: Family Medicine

## 2018-01-13 LAB — ACTH: C206 ACTH: 11 pg/mL (ref 6–50)

## 2018-03-01 ENCOUNTER — Encounter: Payer: Self-pay | Admitting: Family Medicine

## 2018-03-01 ENCOUNTER — Ambulatory Visit: Payer: Commercial Managed Care - PPO | Admitting: Family Medicine

## 2018-03-01 ENCOUNTER — Ambulatory Visit (INDEPENDENT_AMBULATORY_CARE_PROVIDER_SITE_OTHER): Payer: Commercial Managed Care - PPO | Admitting: Family Medicine

## 2018-03-01 VITALS — BP 122/74 | HR 104 | Temp 98.7°F | Ht 63.0 in | Wt 285.4 lb

## 2018-03-01 DIAGNOSIS — R6884 Jaw pain: Secondary | ICD-10-CM | POA: Diagnosis not present

## 2018-03-01 DIAGNOSIS — E2740 Unspecified adrenocortical insufficiency: Secondary | ICD-10-CM | POA: Diagnosis not present

## 2018-03-01 DIAGNOSIS — G43809 Other migraine, not intractable, without status migrainosus: Secondary | ICD-10-CM | POA: Diagnosis not present

## 2018-03-01 NOTE — Assessment & Plan Note (Signed)
BMI 50.56 today.  She is down 2 pounds since her last visit.  Hopefully will have continued improvement after stopping hydrocortisone.  She is working on [employer] diet.

## 2018-03-01 NOTE — Assessment & Plan Note (Signed)
Symptoms are stable.  Discussed starting Imitrex, however patient declined.  She will continue Fioricet and Phenergan as needed.  Hopefully will have some improvement with her new eyeglass prescription.

## 2018-03-01 NOTE — Assessment & Plan Note (Signed)
Clinic by endocrinology.  She has stopped her hydrocortisone and is doing well.

## 2018-03-01 NOTE — Assessment & Plan Note (Signed)
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  Discussed treatment options with patient.  Will start home exercise program focusing on isometrics and range of motion at her TMJ bilaterally.  Offered anti-inflammatory and muscle relaxer however patient declined.  She may be looking into chiropractor and acupuncture as well.

## 2018-03-01 NOTE — Patient Instructions (Signed)
It was very nice to see you today!  Please work on the jaw exercises. Please let me know if you would like to start antiinflammatories and/or muscle relaxers for this.  Please let me know if you would like to try imitrex for your migraines.   Come back to see me in 6-12 months, or sooner as needed.   Take care, Dr Jimmey RalphParker   Jaw Range of Motion Exercises Jaw range of motion exercises are exercises that help your jaw to move better. These exercises can help to prevent:  Difficulty opening your mouth.  Pain in your jaw while it is both open and closed.  What should I be careful of when doing jaw exercises? Make sure that you only do jaw exercises as directed by your health care provider. You should only move your jaw as far as it can go in each direction, if told to do so by your health care provider. Do not move your jaw into positions that cause you any pain. What exercises should I do?  Stick your jaw forward. Hold this position for 1-2 seconds. Allow your jaw to return to its normal position and rest it there for 1-2 seconds. Do this exercise 8 times.  Stand or sit in front of a mirror. Place your tongue on the roof of your mouth, just behind your top teeth. Slowly open and close your jaw, keeping your tongue on the roof of your mouth. While you open and close your mouth, try to keep your jaw from moving toward one side or the other. Repeat this 8 times.  Move your jaw right. Hold this position for 1-2 seconds. Allow your jaw to return to its normal position, and rest it there for 1-2 seconds. Do this exercise 8 times.  Move your jaw left. Hold this position for 1-2 seconds. Allow your jaw to return to its normal position, and rest it there for 1-2 seconds. Do this exercise 8 times.  Open your mouth as far as it is can comfortably go. Hold this position for 1-2 seconds. Then close your mouth and rest for 1-2 seconds. Do this exercise 8 times.  Move your jaw in a circular motion,  starting toward the right (clockwise). Repeat this 8 times.  Move your jaw in a circular motion, starting toward the left (counterclockwise). Repeat this 8 times. Apply moist heat packs or ice packs to your jaw before or after performing your exercises as directed by your health care provider. What else can I do? Avoid the following, if they cause jaw pain or they increase your jaw pain:  Chewing gum.  Clenching your jaw or teeth or keeping tension in your jaw muscles.  Leaning on your jaw, such as resting your jaw in your hand while leaning on a desk.  This information is not intended to replace advice given to you by your health care provider. Make sure you discuss any questions you have with your health care provider. Document Released: 07/15/2008 Document Revised: 01/08/2016 Document Reviewed: 07/03/2014 Elsevier Interactive Patient Education  [employer]2018 [employer] Inc[employer]

## 2018-03-01 NOTE — Progress Notes (Signed)
   Subjective:  Kaitlin Robles is a 27 y.o. female who presents today with a chief complaint of jaw pain.   HPI:  Jaw Pain, chronic problem, new to provider Several year history.  Located on both sides of her jaw, however more prominent on the right.  Worse with chewing and talking.  She has been evaluated by her dentist who told her to work on stress relaxation techniques.  She has a history of teeth clenching at night and wears a mouthguard. No other treatments tried.   Migraines, chronic problem, stable Patient infrequently uses Fioricet.  She recently changed prescription for glasses and thinks this is helped.  She sometimes has to take Phenergan however does not like how it makes her feel very tired and sleepy.  ROS: Per HPI  PMH: She reports that she has never smoked. She has never used smokeless tobacco. She reports that she does not drink alcohol or use drugs.  Objective:  Physical Exam: BP 122/74 (BP Location: Left Arm, Patient Position: Sitting, Cuff Size: Large)   Pulse (!) 104   Temp 98.7 F (37.1 C) (Oral)   Ht 5\' 3"  (1.6 m)   Wt 285 lb 6.4 oz (129.5 kg)   SpO2 96%   BMI 50.56 kg/m   Gen: NAD, resting comfortably HEENT: Bilateral temporal mandible joints with palpable click.  Tender over masseter muscles bilaterally.  Assessment/Plan:  Migraine Symptoms are stable.  Discussed starting Imitrex, however patient declined.  She will continue Fioricet and Phenergan as needed.  Hopefully will have some improvement with her new eyeglass prescription.  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  Discussed treatment options with patient.  Will start home exercise program focusing on isometrics and range of motion at her TMJ bilaterally.  Offered anti-inflammatory and muscle relaxer however patient declined.  She may be looking into chiropractor and acupuncture as well.  Adrenal insufficiency [facility]) [facility] by endocrinology.  She has stopped her hydrocortisone and is doing  well.  Morbid obesity (HCC) BMI 50.56 today.  She is down 2 pounds since her last visit.  Hopefully will have continued improvement after stopping hydrocortisone.  She is working on [employer] diet.   Kaitlin Degreealeb M. Jimmey RalphParker, MD 03/01/2018 10:11 AM

## 2018-07-06 ENCOUNTER — Encounter: Payer: Self-pay | Admitting: Internal Medicine

## 2018-07-06 ENCOUNTER — Ambulatory Visit (INDEPENDENT_AMBULATORY_CARE_PROVIDER_SITE_OTHER): Payer: Commercial Managed Care - PPO | Admitting: Internal Medicine

## 2018-07-06 VITALS — BP 120/78 | HR 102 | Ht 63.0 in | Wt 300.0 lb

## 2018-07-06 DIAGNOSIS — Z23 Encounter for immunization: Secondary | ICD-10-CM

## 2018-07-06 DIAGNOSIS — E559 Vitamin D deficiency, unspecified: Secondary | ICD-10-CM

## 2018-07-06 DIAGNOSIS — E2740 Unspecified adrenocortical insufficiency: Secondary | ICD-10-CM | POA: Diagnosis not present

## 2018-07-06 DIAGNOSIS — E282 Polycystic ovarian syndrome: Secondary | ICD-10-CM | POA: Diagnosis not present

## 2018-07-06 LAB — CORTISOL: CORTISOL PLASMA: 4.5 ug/dL

## 2018-07-06 NOTE — Progress Notes (Signed)
Patient ID: Kaitlin Robles, female   DOB: [birth year], 27 y.o.   MRN: [id number]    HPI  Kaitlin Robles is a 27 y.o.-year-old female, returning for follow-up for history of adrenal insufficiency and PCOS. She moved to [place] in 05/2017.  Last visit 6 months ago.  She gained weight (by her scale: ~7 lbs) after stopping exercise for 2 mo. Restarted it now.  Reviewed pertinent medical history from last visit: Patient describes that she has a history of many courses of prednisone for anaphylaxis to fire aunts bites in 2014, when she was living in [place], after which she started to see endocrinology for severe fatigue (she could not keep a full-time job), weight gain of 80 pounds, and also irregular menstrual cycles.   She was diagnosed with PCOS and started on metformin. She does not feel that this is helping.  However, at that time, as she felt that she was not getting all her answers from endocrinology, she started to see integrative medicine.  Pt. has been found to have a low cortisol level in 2015.  This was only followed initially but she had another low cortisol level in 02/2017 (see scanned results below).  At that time,  she was started on hydrocortisone 20 mg a day (10 + 5 + 5 - she was usually forgetting the last dose of the day).  She was on this dose for approximately 1.5 years, after which she started to decrease by herself. At last visit with PCP she was taking 5 mg a day (started 10/2017)  -she remembers that she had flulike symptoms, fatigue, rash for 3 days,  but this resolved.  She is now feeling well, without significant fatigue.  She is interested in coming off hydrocortisone.  She also describes that she is exercising consistently (starting 05/2017).  She gained almost 80 pounds after her steroid treatments and she is trying to lose weight. She actually lost weight in college, when she was cooking all meals, not eating processed foods.  Regarding her PCOS, she describes having occasional  irregular menstrual cycles in the past (but definitely more than 4 cycles a year), but she has been on OCPs (Junel) since diagnosis of PCOS, in 2014.  She is interested in coming off this medication also.  She had 3 ruptured cysts in last year - last in 05/2017.  She is also on metformin ER 500 mg twice a day but she does not think that this is helping anything.  She is also interested in stopping this medication. No OCP needed for protection - she is in a lesbian relationship. She does not have increased acne or hirsutism.  No h/o Depo-provera, Megace, po ketoconazole, phenytoin, rifampin, chronic fluconazole use. No h/o autoimmune diseases in pt. No excess use of NSAIDs. No h/o generalized infections or HIV. No IVDA. No h/o head injury or severe HA. No h/o malignancy. Had a precancerous colon polyp removed. Now q5 years colonoscopy.  Of note,  She has a history of SIBO (diagnosed around 2015), which is now almost resolved.  At last visit, she mentioned fatigue, nausea with her headaches, muscle aches, and headaches.  Otherwise, she denied weight loss, vomiting, abdominal pain, palpitations, dizziness, syncopal episodes.  At last visit, I reviewed her previous adrenal labs, TFTs, prolactin level, and also checked a cosyntropin stimulation test.  All the results were normal.  Therefore, we stopped hydrocortisone.  She did not feel a difference after stopping this medication.  She has no complaints at this visit (  other than wt gain).  At last visit, we also stopped her oral contraceptives and metformin since she did not feel that these are helping and she was having at least 4 menstrual cycles a year.  As of now, her menstrual cycle are ~1 to 1.5 months apart.  I reviewed patient's cortisol levels from 02/28/2017 (ordered by Dr. Leafy HalfJohn Hart) - [facility]:   A.m. cortisol was normal, as was a cosyntropin stimulation test performed at last visit: Component     Latest Ref Rng & Units 11/10/2017    Cortisol, Plasma     ug/dL 9.7   Component     Latest Ref Rng & Units 01/10/2018 01/10/2018 01/10/2018         8:17 AM  8:55 AM  9:23 AM  Cortisol, Plasma     ug/dL 16.112.2 09.625.5 04.530.0   DHEAS was normal, at 89.  Thyroid tests and prolactin levels normal: Lab Results  Component Value Date   TSH 2.51 11/10/2017   Lab Results  Component Value Date   FREET4 0.68 11/10/2017   T3FREE 3.9 11/10/2017   Lab Results  Component Value Date   PROLACTIN 15.5 11/10/2017   Latest testosterone level was normal: Component     Latest Ref Rng & Units 11/10/2017  Testosterone, Total, LC-MS-MS     2 - 45 ng/dL 30  Free Testosterone     0.1 - 6.4 pg/mL 2.0  Sex Horm Binding Glob, Serum     17 - 124 nmol/L 107   No history of hyponatremia or hyperkalemia:   Chemistry      Component Value Date/Time   NA 138 11/10/2017 0917   NA 141 02/22/2017   K 4.1 11/10/2017 0917   CL 101 11/10/2017 0917   CO2 28 11/10/2017 0917   BUN 11 11/10/2017 0917   BUN 8 02/22/2017   CREATININE 0.83 11/10/2017 0917   GLU 83 04/14/2017      Component Value Date/Time   CALCIUM 9.1 11/10/2017 0917   ALKPHOS 74 11/10/2017 0917   AST 13 11/10/2017 0917   ALT 11 11/10/2017 0917   BILITOT 0.3 11/10/2017 0917     She also has a history of hypertriglyceridemia, chronic urticaria, lichen sclerosus, ADHD.  She had kidney stone removal in 2013.   Lipids (11/21/2017): 256/428/48/n/c  Latest HbA1c (11/21/2017): 5.3%  ROS: Constitutional: + weight gain/no weight loss, no fatigue, no subjective hyperthermia, no subjective hypothermia Eyes: no blurry vision, no xerophthalmia ENT: no sore throat, no nodules palpated in neck, no dysphagia, no odynophagia, no hoarseness Cardiovascular: no CP/no SOB/no palpitations/no leg swelling Respiratory: no cough/no SOB/no wheezing Gastrointestinal: no N/no V/no D/no C/no acid reflux Musculoskeletal: no muscle aches/no joint aches Skin: no rashes, no hair loss Neurological: no  tremors/no numbness/no tingling/no dizziness  I reviewed pt's medications, allergies, PMH, social hx, family hx, and changes were documented in the history of present illness. Otherwise, unchanged from my initial visit note.  Past Medical History:  Diagnosis Date  . Adrenal insufficiency (Addison's disease) (HCC)   . Chronic UTI   . Colon polyps   . Depression   . Head ache   . Hyperlipidemia   . Lichen sclerosus of female genitalia   . Malabsorption   . Migraine headache   . Urticaria    Past Surgical History:  Procedure Laterality Date  . CHOLECYSTECTOMY  2013  . KIDNEY STONE SURGERY     Social History   Socioeconomic History  . Marital status: Single  Spouse name: Not on file  . Number of children: 0  . Years of education: Not on file  . Highest education level: Not on file  Occupational History  .  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 Financial resource strain: Not on file  . Food insecurity:    Worry: Not on file    Inability: Not on file  . Transportation needs:    Medical: Not on file    Non-medical: Not on file  Tobacco Use  . Smoking status: Never Smoker  . Smokeless tobacco: Never Used  Substance and Sexual Activity  . Alcohol use: Never    Frequency: Never  . Drug use: Never   Current Outpatient Medications on File Prior to Visit  Medication Sig Dispense Refill  . atomoxetine (STRATTERA) 60 MG capsule     . BLISOVI FE 1/20 1-20 MG-MCG tablet     . buPROPion (WELLBUTRIN XL) 150 MG 24 hr tablet     . butalbital-acetaminophen-caffeine (FIORICET, ESGIC) 50-325-40 MG tablet Take by mouth 2 (two) times daily as needed for headache.    . cetirizine (ZYRTEC) 10 MG tablet Take 10 mg by mouth daily.    Marland Kitchen EPINEPHrine 0.3 mg/0.3 mL IJ SOAJ injection Inject 0.3 mg into the muscle once.    . escitalopram (LEXAPRO) 20 MG tablet Take 20 mg by mouth daily.    . famotidine (PEPCID) 20 MG tablet Take 20 mg by mouth 2 (two) times daily.    .  fluticasone (FLONASE) 50 MCG/ACT nasal spray Place into both nostrils daily.    . promethazine (PHENERGAN) 12.5 MG tablet Take 1 tablet (12.5 mg total) by mouth every 6 (six) hours as needed for nausea or vomiting. 30 tablet 0  . triamcinolone ointment (KENALOG) 0.5 % Apply 1 application topically 2 (two) times daily. 30 g 0   No current facility-administered medications on file prior to visit.    Allergies  Allergen Reactions  . Banana Nausea Only  . Ceclor [Cefaclor] Hives  . Venomil Wasp Venom [Wasp Venom] Anaphylaxis   Family History  Problem Relation Age of Onset  . Diabetes Mother   . Hyperlipidemia Mother   . Hypertension Mother   . Miscarriages / [place] Mother   . Hyperlipidemia Daughter   . COPD Maternal Grandfather   . Hypertension Maternal Grandfather   . Heart disease Maternal Grandfather   . Hypertension Paternal Grandfather   . Heart attack Paternal Grandfather     PE: BP 120/78   Ht 5\' 3"  (1.6 m) Comment: measured  Wt 300 lb (136.1 kg)   BMI 53.14 kg/m   Wt Readings from Last 3 Encounters:  07/06/18 300 lb (136.1 kg)  03/01/18 285 lb 6.4 oz (129.5 kg)  01/03/18 287 lb 12.8 oz (130.5 kg)   Constitutional: overweight, in NAD Eyes: PERRLA, EOMI, no exophthalmos ENT: moist mucous membranes, no thyromegaly, no cervical lymphadenopathy Cardiovascular: Tachycardia, RR, No MRG Respiratory: CTA B Gastrointestinal: abdomen soft, NT, ND, BS+ Musculoskeletal: no deformities, strength intact in all 4 Skin: moist, warm, no rashes, no acanthosis nigricans Neurological: no tremor with outstretched hands, DTR normal in all 4  ASSESSMENT: 1.  Resolved adrenal insufficiency  2. PCOS  3. Vit D def.  PLAN:  1. Adrenal insufficiency (AI) -Resolved -She was diagnosed with adrenal insufficiency based on salivary cortisol results after urine which she had prednisone courses for anaphylaxis to fire ant stings.  She probably had a low adrenal cortisol production at  that time  due to the inhibitor effect of exogenous prednisone to the hypothalamus.  I explained that this is usually transient and indeed, a cosyntropin stimulation test obtained at last  isit was normal.   -Before last visit, she decrease her hydrocortisone dose from 15-20 mg daily which she took for approximately 1.5 years to only 5 mg daily and she was feeling well on this dose.  Based on the cosyntropin stimulation test results, I advised her to stop the medication completely but keep it at hand in case she develops adrenal insufficiency symptoms.  She did not have to restart it since last visit. -I strongly advised her against steroid use in the future if possible, but no other intervention or investigation is needed for now  2. PCOS -She was diagnosed with PCOS in 2014 due to menstrual irregularity.  I do not have the results of this investigation. -She does have weight gain, ovarian cysts, and likely, insulin resistance.  She had an insulin level checked by her integrative medicine physician and this was elevated.  She was started on metformin but she did not feel that this is helping much and we stopped this at last visit.  She did gain weight since last visit (per her scale approximately 15 pounds), however, per her scale at home, approximately 7 pounds. -She was also on OCPs for 4 years before her last visit and she was interested in coming off as before starting them she had more than 4 menstrual cycles a year.  We also stopped these at last visit. -We will check a testosterone level at this visit -We again discussed about the importance of losing weight.  She already started to exercise.  Advised her to have a scale at home and weigh herself every day. -We will check an HbA1c at this visit.  Latest HbA1c reviewed was 5.3% 7 months ago.  3. Vit D def - latest vit D was low 11/2017: 21.9 - We will start start 4000 units daily >> will follow up with PCP for this  Orders Placed This Encounter   Procedures  . Cortisol  . Testosterone Free with SHBG    + Flu shot today  Component     Latest Ref Rng & Units 07/06/2018          Testosterone, Serum (Total)     ng/dL 18  % Free Testosterone     % 1.7  Free Testosterone, S     pg/mL 3.1  Sex Hormone Binding Globulin     nmol/L 28.8  Cortisol, Plasma     ug/dL 4.5  All labs are normal.  Carlus Pavlov, MD PhD [facility]

## 2018-07-06 NOTE — Patient Instructions (Addendum)
Please stop at the lab.  Please start 4000 units vitamin D daily.  Please come back for a follow-up appointment in 1 year.

## 2018-07-12 LAB — TESTOSTERONE, FREE AND TOTAL (INCLUDES SHBG)-(MALES)
% FREE TESTOSTERONE: 1.7 %
FREE TESTOSTERONE, S: 3.1 pg/mL
SEX HORMONE BINDING GLOBULIN: 28.8 nmol/L
Testosterone, Serum (Total): 18 ng/dL

## 2018-08-08 ENCOUNTER — Encounter: Payer: Self-pay | Admitting: Internal Medicine

## 2018-08-21 ENCOUNTER — Ambulatory Visit (INDEPENDENT_AMBULATORY_CARE_PROVIDER_SITE_OTHER): Payer: Commercial Managed Care - PPO | Admitting: Family Medicine

## 2018-08-21 ENCOUNTER — Encounter: Payer: Self-pay | Admitting: Family Medicine

## 2018-08-21 VITALS — BP 118/74 | HR 100 | Temp 97.7°F | Ht 63.0 in | Wt 303.4 lb

## 2018-08-21 DIAGNOSIS — R202 Paresthesia of skin: Secondary | ICD-10-CM

## 2018-08-21 DIAGNOSIS — H532 Diplopia: Secondary | ICD-10-CM | POA: Diagnosis not present

## 2018-08-21 NOTE — Patient Instructions (Signed)
It was very nice to see you today!  We will check blood work today.  Please send me your blood work.  We will contact you with blood work when it is available.   Take care, Dr Jimmey Ralph

## 2018-08-21 NOTE — Progress Notes (Signed)
   Subjective:  Kaitlin Robles is a 28 y.o. female who presents today for same-day appointment with a chief complaint of diplopia.   HPI:  Diplopia, Acute problem Started about 4 months ago.  Symptoms seem to be worsening.  She saw her optometrist who reportedly did several tests which did not show any obvious cause.  She was then referred to see a neuro ophthalmologist.  She will be seeing 1 later this week at [facility]  Symptoms seem to be worsening over the last few weeks.  She has had some associated numbness and tingling in her hands and feet.  No weakness.  No nausea or vomiting.  No obvious alleviating or aggravating factors.   ROS: Per HPI  PMH: She reports that she has never smoked. She has never used smokeless tobacco. She reports that she does not drink alcohol or use drugs.  Objective:  Physical Exam: BP 118/74 (BP Location: Left Arm, Patient Position: Sitting, Cuff Size: Large)   Pulse 100   Temp 97.7 F (36.5 C) (Oral)   Ht 5\' 3"  (1.6 m)   Wt (!) 303 lb 6.4 oz (137.6 kg)   SpO2 98%   BMI 53.74 kg/m   Wt Readings from Last 3 Encounters:  08/21/18 (!) 303 lb 6.4 oz (137.6 kg)  07/06/18 300 lb (136.1 kg)  03/01/18 285 lb 6.4 oz (129.5 kg)  Gen: NAD, resting comfortably HEENT: Normal-appearing eyes bilaterally.  Extraocular eye movements intact bilaterally. CV: RRR with no murmurs appreciated Pulm: NWOB, CTAB with no crackles, wheezes, or rhonchi Neuro: Grossly normal, moves all extremities Psych: Cranial nerves II through XII intact.  Visual acuity grossly intact.  Strength 5 out of 5 in upper and lower extremities.  Sensation light touch intact throughout.  Assessment/Plan:  Diplopia Symptoms seem to be stable.  She will be undergoing further management via neuro-ophthalmology later this week.  Discussed warning signs and reasons to seek emergent care.  Paresthesias Unclear underlying etiology.  Potentially related to the above.  Recently had blood work done which showed  normal TSH, CMET, and A1c.  Asked her to send [place] these records although they can be scanned into the chart.  Will check B12 today.  Depending on results of above, may need referral to neurology for further investigation.  Discussed reasons to return care and seek emergent care.  Time Spent: I spent >15 minutes face-to-face with the patient, with more than half spent on counseling for management plan for her diplopia and paresthesias.   Katina Degree. Jimmey Ralph, MD 08/21/2018 5:01 PM

## 2018-08-22 LAB — VITAMIN B12: VITAMIN B 12: 359 pg/mL (ref 211–911)

## 2018-08-22 NOTE — Progress Notes (Signed)
Dr Lavone Neri interpretation of your lab work:  Good news! Your B12 level is normal. We do not need to do any further testing at this point. I am hopeful that the ophthalmologist will have some more answers.    If you have any additional questions, please give [place] a call or send [place] a message through [place].  Take care, Dr Jimmey Ralph

## 2018-08-25 DIAGNOSIS — H04123 Dry eye syndrome of bilateral lacrimal glands: Secondary | ICD-10-CM | POA: Insufficient documentation

## 2018-08-25 DIAGNOSIS — H532 Diplopia: Secondary | ICD-10-CM | POA: Insufficient documentation

## 2018-10-19 LAB — LIPID PANEL
Cholesterol: 212 — AB (ref 0–200)
HDL: 39 (ref 35–70)
LDL Cholesterol: 120
Triglycerides: 264 — AB (ref 40–160)

## 2018-10-19 LAB — BASIC METABOLIC PANEL
BUN: 5 (ref 4–21)
Creatinine: 0.8 (ref 0.5–1.1)
Glucose: 79
Potassium: 4.5 (ref 3.4–5.3)
Sodium: 139 (ref 137–147)

## 2018-10-19 LAB — HEPATIC FUNCTION PANEL
ALT: 15 (ref 7–35)
AST: 19 (ref 13–35)
Alkaline Phosphatase: 90 (ref 25–125)
Bilirubin, Total: 0.3

## 2018-10-19 LAB — CBC AND DIFFERENTIAL
HCT: 40 (ref 36–46)
Hemoglobin: 12.6 (ref 12.0–16.0)
WBC: 8.3

## 2018-10-19 LAB — VITAMIN D 25 HYDROXY (VIT D DEFICIENCY, FRACTURES): Vit D, 25-Hydroxy: 31.1

## 2018-11-30 ENCOUNTER — Encounter: Payer: Self-pay | Admitting: Family Medicine

## 2018-12-01 ENCOUNTER — Ambulatory Visit (INDEPENDENT_AMBULATORY_CARE_PROVIDER_SITE_OTHER): Payer: Commercial Managed Care - PPO | Admitting: Family Medicine

## 2018-12-01 ENCOUNTER — Encounter: Payer: Self-pay | Admitting: Family Medicine

## 2018-12-01 DIAGNOSIS — E559 Vitamin D deficiency, unspecified: Secondary | ICD-10-CM | POA: Diagnosis not present

## 2018-12-01 DIAGNOSIS — Z8659 Personal history of other mental and behavioral disorders: Secondary | ICD-10-CM | POA: Diagnosis not present

## 2018-12-01 DIAGNOSIS — M533 Sacrococcygeal disorders, not elsewhere classified: Secondary | ICD-10-CM

## 2018-12-01 DIAGNOSIS — R221 Localized swelling, mass and lump, neck: Secondary | ICD-10-CM | POA: Diagnosis not present

## 2018-12-01 MED ORDER — VITAMIN D (ERGOCALCIFEROL) 1.25 MG (50000 UNIT) PO CAPS: 50000.0000 [IU] | ORAL_CAPSULE | ORAL | 3 refills | Status: DC

## 2018-12-01 NOTE — Assessment & Plan Note (Signed)
Discussed lifestyle interventions.  Offered referral for nutrition however patient declined.

## 2018-12-01 NOTE — Assessment & Plan Note (Signed)
Latest vitamin D level 31.  Would ideally like for her to be above 50.  Will start 50,000 international units weekly.  Recheck in 3 to 6 months.

## 2018-12-01 NOTE — Progress Notes (Signed)
    Chief Complaint:  Kaitlin Robles is a 28 y.o. female who presents today for a virtual office visit with a chief complaint of diplopia.   Assessment/Plan:  Diplopia Continue management plan per ophthalmology.  Sacral pain No red flags.  Discussed home exercise program and handout was given.  Neck Lump Given the symptoms of been persistent for several weeks to months, will check ultrasound to rule out malignancy or other concerning pathologies.  Vitamin D deficiency Latest vitamin D level 31.  Would ideally like for her to be above 50.  Will start 50,000 international units weekly.  Recheck in 3 to 6 months.  Morbid obesity (HCC) Discussed lifestyle interventions.  Offered referral for nutrition however patient declined.  History of depression Stable.  Continue management per psychiatry.  Continue Wellbutrin 300 mg daily, Valium 2.5 mg every 6 hours as needed, and Lexapro 20 mg daily.    Subjective:  HPI:  She has been having some sacral pain over the last couple of days.  She has previously seen chiropractic for this however is not able to do this due to COVID-19 pandemic.  Symptoms seem to be worse with sitting and standing.  No reported bowel or bladder incontinence.  She has also noticed lumps on her neckline for the past several weeks.  They are painful to palpation.  They have been unchanged for several weeks to months.  Her established medical conditions are outlined below:  # Diplopia -Follows with optometry and ophthalmology/neurology Recently saw [facility] who diagnosed with dry eyes Had a second opinion coming up at [facility].  # Vitamin D Deficiency - On 5000IU once per daily.  Tolerating well.  # Depression / Anxiety / ADHD - Follows with psychiatry - On Wellbutrin 300mg  daily, lexapro 20mg  daily, and valium 2.5mg  every 6 hours as needed. - ROS: No reported SI or HI  ROS: Per HPI  PMH: She reports that she has never smoked. She has never used smokeless  tobacco. She reports that she does not drink alcohol or use drugs.      Objective/Observations  Physical Exam: Gen: NAD, resting comfortably Pulm: Normal work of breathing Neuro: Grossly normal, moves all extremities Psych: Normal affect and thought content  Virtual Visit via Video   I connected with Kaitlin Robles on 12/01/18 at  8:40 AM EDT by a video enabled telemedicine application and verified that I am speaking with the correct person using two identifiers. I discussed the limitations of evaluation and management by telemedicine and the availability of in person appointments. The patient expressed understanding and agreed to proceed.   Patient location: Home Provider location: [place] [employer] Persons participating in the virtual visit: Myself and patient  Time Spent: I spent >25 minutes face-to-face with the patient, with more than half spent on counseling for management plan for her diplopia, depression, obesity, vitamin D deficiency, sacral pain, and neck lump.      Katina Degree. Jimmey Ralph, MD 12/01/2018 9:29 AM

## 2018-12-01 NOTE — Assessment & Plan Note (Signed)
Stable.  Continue management per psychiatry.  Continue Wellbutrin 300 mg daily, Valium 2.5 mg every 6 hours as needed, and Lexapro 20 mg daily.

## 2018-12-07 ENCOUNTER — Encounter: Payer: Self-pay | Admitting: Family Medicine

## 2019-01-15 ENCOUNTER — Other Ambulatory Visit: Payer: Self-pay | Admitting: Family Medicine

## 2019-01-15 ENCOUNTER — Ambulatory Visit
Admission: RE | Admit: 2019-01-15 | Discharge: 2019-01-15 | Disposition: A | Discharge status: Home / Self Care | Payer: Commercial Managed Care - PPO | Source: Ambulatory Visit | Attending: Family Medicine | Admitting: Family Medicine

## 2019-01-15 DIAGNOSIS — R221 Localized swelling, mass and lump, neck: Secondary | ICD-10-CM

## 2019-01-16 NOTE — Progress Notes (Signed)
Please inform patient of the following:  She has a few enlarged lymph nodes but no other abnormalities. These appear normal and are not a cause for concern. Would like for her to keep an eye on it and let us know if it changes in anyway, but do not need to do any further testing at this point.  Kaitlin Robles. Jimmey Ralph, MD 01/16/2019 4:48 PM

## 2019-01-23 LAB — HM PAP SMEAR: HM Pap smear: NEGATIVE

## 2019-02-14 ENCOUNTER — Encounter: Payer: Self-pay | Admitting: Family Medicine

## 2019-03-28 ENCOUNTER — Telehealth: Payer: Self-pay

## 2019-03-28 NOTE — Telephone Encounter (Signed)
Called pt to schedule virtual visit, no answer, VM full and unable to leave message

## 2019-03-28 NOTE — Telephone Encounter (Signed)
Copied from [place] [id number]. 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 She is having joint and muscle aches, sorethroat, dry cough and achy.please call pt

## 2019-03-29 ENCOUNTER — Encounter: Payer: Self-pay | Admitting: Family Medicine

## 2019-03-29 ENCOUNTER — Ambulatory Visit (INDEPENDENT_AMBULATORY_CARE_PROVIDER_SITE_OTHER): Payer: Commercial Managed Care - PPO | Admitting: Family Medicine

## 2019-03-29 ENCOUNTER — Other Ambulatory Visit: Payer: Self-pay

## 2019-03-29 DIAGNOSIS — Z20822 Contact with and (suspected) exposure to covid-19: Secondary | ICD-10-CM

## 2019-03-29 DIAGNOSIS — Z20828 Contact with and (suspected) exposure to other viral communicable diseases: Secondary | ICD-10-CM | POA: Diagnosis not present

## 2019-03-29 NOTE — Patient Instructions (Signed)
Please follow up if symptoms do not improve or as needed.   We have put in an order for you to be tested for COVID-19.  You do not need an appointment to go get testing -- please proceed to one of the following drive-up testing locations at your convenience. The line closes at 3:30pm daily. Test results may take up to a week to return.  [place] Location: [address], [facility] ([facility]) Hours: 8a-3:45p, M-F  [facility] Location: [address], [place], [place] [id number] [facility]) Hours: 8a-3:45p, M-F  [facility] Location: [facility] (across from [facility]) Hours: 8a-3:45p, M-F      Person Under Monitoring Name: Kaitlin Robles  Location: [address] [place] [address]   Infection Prevention Recommendations for Individuals Confirmed to have, or Being Evaluated for, 2019 Novel Coronavirus (COVID-19) Infection Who Receive Care at Home  Individuals who are confirmed to have, or are being evaluated for, COVID-19 should follow the prevention steps below until a healthcare provider or local or state health department says they can return to normal activities.  Stay home except to get medical care You should restrict activities outside your home, except for getting medical care. Do not go to work, school, or public areas, and do not use public transportation or taxis.  Call ahead before visiting your doctor Before your medical appointment, call the healthcare provider and tell them that you have, or are being evaluated for, COVID-19 infection. This will help the healthcare provider's office take steps to keep other people from getting infected. Ask your healthcare provider to call the local or state health department.  Monitor your symptoms Seek prompt medical attention if your illness is worsening (e.g., difficulty  breathing). Before going to your medical appointment, call the healthcare provider and tell them that you have, or are being evaluated for, COVID-19 infection. Ask your healthcare provider to call the local or state health department.  Wear a facemask You should wear a facemask that covers your nose and mouth when you are in the same room with other people and when you visit a healthcare provider. People who live with or visit you should also wear a facemask while they are in the same room with you.  Separate yourself from other people in your home As much as possible, you should stay in a different room from other people in your home. Also, you should use a separate bathroom, if available.  Avoid sharing household items You should not share dishes, drinking glasses, cups, eating utensils, towels, bedding, or other items with other people in your home. After using these items, you should wash them thoroughly with soap and water.  Cover your coughs and sneezes Cover your mouth and nose with a tissue when you cough or sneeze, or you can cough or sneeze into your sleeve. Throw used tissues in a lined trash can, and immediately wash your hands with soap and water for at least 20 seconds or use an alcohol-based hand rub.  Wash your [employer]hands Wash your hands often and thoroughly with soap and water for at least 20 seconds. You can use an alcohol-based hand sanitizer if soap and water are not available and if your hands are not visibly dirty. Avoid touching your eyes, nose, and mouth with unwashed hands.   Prevention Steps for Caregivers and Household Members of Individuals Confirmed to have, or Being Evaluated for, COVID-19  Infection Being Cared for in the Home  If you live with, or provide care at home for, a person confirmed to have, or being evaluated for, COVID-19 infection please follow these guidelines to prevent infection:  Follow healthcare provider's instructions Make sure that you  understand and can help the patient follow any healthcare provider instructions for all care.  Provide for the patient's basic needs You should help the patient with basic needs in the home and provide support for getting groceries, prescriptions, and other personal needs.  Monitor the patient's symptoms If they are getting sicker, call his or her medical provider and tell them that the patient has, or is being evaluated for, COVID-19 infection. This will help the healthcare provider's office take steps to keep other people from getting infected. Ask the healthcare provider to call the local or state health department.  Limit the number of people who have contact with the patient  If possible, have only one caregiver for the patient.  Other household members should stay in another home or place of residence. If this is not possible, they should stay  in another room, or be separated from the patient as much as possible. Use a separate bathroom, if available.  Restrict visitors who do not have an essential need to be in the home.  Keep older adults, very young children, and other sick people away from the patient Keep older adults, very young children, and those who have compromised immune systems or chronic health conditions away from the patient. This includes people with chronic heart, lung, or kidney conditions, diabetes, and cancer.  Ensure good ventilation Make sure that shared spaces in the home have good air flow, such as from an air conditioner or an opened window, weather permitting.  Wash your hands often  Wash your hands often and thoroughly with soap and water for at least 20 seconds. You can use an alcohol based hand sanitizer if soap and water are not available and if your hands are not visibly dirty.  Avoid touching your eyes, nose, and mouth with unwashed hands.  Use disposable paper towels to dry your hands. If not available, use dedicated cloth towels and replace  them when they become wet.  Wear a facemask and gloves  Wear a disposable facemask at all times in the room and gloves when you touch or have contact with the patient's blood, body fluids, and/or secretions or excretions, such as sweat, saliva, sputum, nasal mucus, vomit, urine, or feces.  Ensure the mask fits over your nose and mouth tightly, and do not touch it during use.  Throw out disposable facemasks and gloves after using them. Do not reuse.  Wash your hands immediately after removing your facemask and gloves.  If your personal clothing becomes contaminated, carefully remove clothing and launder. Wash your hands after handling contaminated clothing.  Place all used disposable facemasks, gloves, and other waste in a lined container before disposing them with other household waste.  Remove gloves and wash your hands immediately after handling these items.  Do not share dishes, glasses, or other household items with the patient  Avoid sharing household items. You should not share dishes, drinking glasses, cups, eating utensils, towels, bedding, or other items with a patient who is confirmed to have, or being evaluated for, COVID-19 infection.  After the person uses these items, you should wash them thoroughly with soap and water.  Wash laundry thoroughly  Immediately remove and wash clothes or bedding that have blood, body  fluids, and/or secretions or excretions, such as sweat, saliva, sputum, nasal mucus, vomit, urine, or feces, on them.  Wear gloves when handling laundry from the patient.  Read and follow directions on labels of laundry or clothing items and detergent. In general, wash and dry with the warmest temperatures recommended on the label.  Clean all areas the individual has used often  Clean all touchable surfaces, such as counters, tabletops, doorknobs, bathroom fixtures, toilets, phones, keyboards, tablets, and bedside tables, every day. Also, clean any surfaces that  may have blood, body fluids, and/or secretions or excretions on them.  Wear gloves when cleaning surfaces the patient has come in contact with.  Use a diluted bleach solution (e.g., dilute bleach with 1 part bleach and 10 parts water) or a household disinfectant with a label that says EPA-registered for coronaviruses. To make a bleach solution at home, add 1 tablespoon of bleach to 1 quart (4 cups) of water. For a larger supply, add  cup of bleach to 1 gallon (16 cups) of water.  Read labels of cleaning products and follow recommendations provided on product labels. Labels contain instructions for safe and effective use of the cleaning product including precautions you should take when applying the product, such as wearing gloves or eye protection and making sure you have good ventilation during use of the product.  Remove gloves and wash hands immediately after cleaning.  Monitor yourself for signs and symptoms of illness Caregivers and household members are considered close contacts, should monitor their health, and will be asked to limit movement outside of the home to the extent possible. Follow the monitoring steps for close contacts listed on the symptom monitoring form.   ? If you have additional questions, contact your local health department or call the epidemiologist on call at [phone] (available 24/7). ? This guidance is subject to change. For the most up-to-date guidance from [facility], please refer to their website: [email or web address]

## 2019-03-29 NOTE — Progress Notes (Signed)
Virtual Visit via Video Note  Subjective  CC:  Chief Complaint  Patient presents with  . Possible COVID    She has been exposed by multiple people at work.. Sore throat, cough, bad body aches,      I connected with Denyse Dago on 03/29/19 at 11:20 AM EDT by a video enabled telemedicine application and verified that I am speaking with the correct person using two identifiers. Location patient: Home Location provider: [facility] [place], Office Persons participating in the virtual visit: Ronna Herskowitz, Leamon Arnt, MD Lilli Light, CMA Same day acute visit; PCP not available. New pt to me. Chart reviewed.   I discussed the limitations of evaluation and management by telemedicine and the availability of in person appointments. The patient expressed understanding and agreed to proceed. HPI: Kaitlin Robles is a 28 y.o. female who was contacted today to address the problems listed above in the chief complaint. . 28 yo with known covid-19 exposure at work; 3-4 coworkers have tested positive, several of which she was in close proximity with unmasked for a prolonged period of time.  3 days ago she started having mild myalgias and sore throat with mild cough.  She denies fevers, loss of taste or smell, GI symptoms, shortness of breath.  She would like to get tested.  She has no sinus pain.  She is eating well. Assessment  1. Exposure to Covid-19 Virus      Plan   Exposure to COVID-19 virus with symptoms: Referred for community testing.  Appropriate education counseling done.  Patient will work from home and self quarantine until test results are given. I discussed the assessment and treatment plan with the patient. The patient was provided an opportunity to ask questions and all were answered. The patient agreed with the plan and demonstrated an understanding of the instructions.   The patient was advised to call back or seek an in-person evaluation if the symptoms  worsen or if the condition fails to improve as anticipated. Follow up: PRN Visit date not found  No orders of the defined types were placed in this encounter.     I reviewed the patients updated PMH, FH, and SocHx.    Patient Active Problem List   Diagnosis Date Noted  . Jaw pain 03/01/2018  . Morbid obesity ([place]) 03/01/2018  . Eczema 11/30/2017  . Vitamin D deficiency 11/30/2017  . Adrenal insufficiency ([place]) 11/10/2017  . Polyp of colon 11/10/2017  . Chronic urticaria 11/10/2017  . Migraine 11/10/2017  . PCOS (polycystic ovarian syndrome) 11/10/2017  . History of anaphylaxis 11/10/2017  . Attention deficit hyperactivity disorder (ADHD) 11/10/2017  . History of depression 11/10/2017  . Allergic rhinitis 11/10/2017   Current Meds  Medication Sig  . atomoxetine (STRATTERA) 60 MG capsule   . BLISOVI FE 1/20 1-20 MG-MCG tablet   . buPROPion (WELLBUTRIN XL) 300 MG 24 hr tablet 300 mg.   . butalbital-acetaminophen-caffeine (FIORICET, ESGIC) 50-325-40 MG tablet Take by mouth 2 (two) times daily as needed for headache.  . cetirizine (ZYRTEC) 10 MG tablet Take 10 mg by mouth daily.  . diazepam (VALIUM) 5 MG tablet Take 2.5 mg by mouth every 6 (six) hours as needed for anxiety.  Marland Kitchen EPINEPHrine 0.3 mg/0.3 mL IJ SOAJ injection Inject 0.3 mg into the muscle once.  . escitalopram (LEXAPRO) 20 MG tablet Take 20 mg by mouth daily.  . fluticasone (FLONASE) 50 MCG/ACT nasal spray Place into both nostrils daily.  Marland Kitchen  triamcinolone ointment (KENALOG) 0.5 % Apply 1 application topically 2 (two) times daily.  . Vitamin D, Ergocalciferol, (DRISDOL) 1.25 MG (50000 UT) CAPS capsule Take 1 capsule (50,000 Units total) by mouth every 7 (seven) days.    Allergies: Patient is allergic to banana; ceclor [cefaclor]; venomil wasp venom [wasp venom]; and nickel. Family History: Patient family history includes COPD in her maternal grandfather; Diabetes in her mother; Heart attack in her paternal grandfather;  Heart disease in her maternal grandfather; Hyperlipidemia in her daughter and mother; Hypertension in her maternal grandfather, mother, and paternal grandfather; Miscarriages / [place] in her mother. Social History:  Patient  reports that she has never smoked. She has never used smokeless tobacco. She reports that she does not drink alcohol or use drugs.  Review of Systems: Constitutional: Negative for fever malaise or anorexia Cardiovascular: negative for chest pain Respiratory: negative for SOB or persistent cough Gastrointestinal: negative for abdominal pain  OBJECTIVE Vitals: There were no vitals taken for this visit. General: no acute distress , A&Ox3, appears well  Willow Oraamille L Madilynn Montante, MD

## 2019-03-30 LAB — NOVEL CORONAVIRUS, NAA: SARS-CoV-2, NAA: NOT DETECTED

## 2019-05-28 ENCOUNTER — Other Ambulatory Visit: Payer: Self-pay

## 2019-05-28 ENCOUNTER — Encounter (HOSPITAL_COMMUNITY): Payer: Self-pay | Admitting: Emergency Medicine

## 2019-05-28 ENCOUNTER — Emergency Department (HOSPITAL_COMMUNITY)
Admission: EM | Admit: 2019-05-28 | Discharge: 2019-05-28 | Disposition: A | Discharge status: Home / Self Care | Payer: Commercial Managed Care - PPO | Attending: Emergency Medicine | Admitting: Emergency Medicine

## 2019-05-28 ENCOUNTER — Ambulatory Visit: Payer: Self-pay | Admitting: *Deleted

## 2019-05-28 DIAGNOSIS — K625 Hemorrhage of anus and rectum: Secondary | ICD-10-CM

## 2019-05-28 DIAGNOSIS — Z79899 Other long term (current) drug therapy: Secondary | ICD-10-CM | POA: Diagnosis not present

## 2019-05-28 LAB — COMPREHENSIVE METABOLIC PANEL
ALT: 19 U/L (ref 0–44)
AST: 20 U/L (ref 15–41)
Albumin: 4 g/dL (ref 3.5–5.0)
Alkaline Phosphatase: 77 U/L (ref 38–126)
Anion gap: 7 (ref 5–15)
BUN: 9 mg/dL (ref 6–20)
CO2: 27 mmol/L (ref 22–32)
Calcium: 9.3 mg/dL (ref 8.9–10.3)
Chloride: 103 mmol/L (ref 98–111)
Creatinine, Ser: 0.83 mg/dL (ref 0.44–1.00)
GFR calc Af Amer: >60 mL/min (ref >60)
GFR calc non Af Amer: >60 mL/min (ref >60)
Glucose, Bld: 103 mg/dL — ABNORMAL HIGH (ref 70–99)
Potassium: 4.1 mmol/L (ref 3.5–5.1)
Sodium: 137 mmol/L (ref 135–145)
Total Bilirubin: 0.8 mg/dL (ref 0.3–1.2)
Total Protein: 7.6 g/dL (ref 6.5–8.1)

## 2019-05-28 LAB — ABO/RH: ABO/RH(D): A POS

## 2019-05-28 LAB — CBC
HCT: 40.5 % (ref 36.0–46.0)
Hemoglobin: 13.1 g/dL (ref 12.0–15.0)
MCH: 27.8 pg (ref 26.0–34.0)
MCHC: 32.3 g/dL (ref 30.0–36.0)
MCV: 85.8 fL (ref 80.0–100.0)
Platelets: 305 10*3/uL (ref 150–400)
RBC: 4.72 MIL/uL (ref 3.87–5.11)
RDW: 13.8 % (ref 11.5–15.5)
WBC: 10 10*3/uL (ref 4.0–10.5)
nRBC: 0 % (ref 0.0–0.2)

## 2019-05-28 LAB — I-STAT BETA HCG BLOOD, ED (MC, WL, AP ONLY): I-stat hCG, quantitative: <5 m[IU]/mL (ref <5)

## 2019-05-28 LAB — TYPE AND SCREEN
ABO/RH(D): A POS
Antibody Screen: NEGATIVE

## 2019-05-28 LAB — POC OCCULT BLOOD, ED: Fecal Occult Bld: POSITIVE — AB

## 2019-05-28 MED ORDER — FENTANYL CITRATE (PF) 100 MCG/2ML IJ SOLN
50.0000 ug | Freq: Once | INTRAMUSCULAR | Status: AC
  Administered 2019-05-28: 19:00:00 50 ug via INTRAVENOUS
  Filled 2019-05-28: qty 2

## 2019-05-28 MED ORDER — SODIUM CHLORIDE 0.9 % IV BOLUS
1000.0000 mL | Freq: Once | INTRAVENOUS | Status: AC
  Administered 2019-05-28: 1000 mL via INTRAVENOUS

## 2019-05-28 NOTE — ED Notes (Signed)
MD made aware of trending blood pressures. 

## 2019-05-28 NOTE — ED Triage Notes (Addendum)
Pt complaint of bright red rectal bleeding since yesterday; pt complaint of 5 episodes; associated worsening back pain. Recent colon polyp.

## 2019-05-28 NOTE — Telephone Encounter (Signed)
Noted. Patient seen at ED

## 2019-05-28 NOTE — Discharge Instructions (Addendum)
Blood count was stable.  Recommend follow-up with a gastroenterologist.  Phone number given.  If office does not call you by Wednesday midday, call them.  Continue to monitor your blood pressure.

## 2019-05-28 NOTE — ED Notes (Signed)
CURRENTLY USING PHONE 

## 2019-05-28 NOTE — Telephone Encounter (Signed)
See note

## 2019-05-28 NOTE — ED Provider Notes (Signed)
[facility]-EMERGENCY DEPT Provider Note   CSN: [id number] Arrival date & time: 05/28/19  1154     History   Chief Complaint Chief Complaint  Patient presents with  . Rectal Bleeding    HPI Kaitlin Robles is a 28 y.o. female.     Chief complaint bright red blood per rectum for the past 24 hours.  Patient has had 2 colonoscopies in the past.  The first revealed colonic polyps; the second was negative.  Review of systems positive for right lateral abdominal pain.  Bleeding has slowed down today.  No evidence of syncope, weakness, maroon stool.  Severity is moderate.  Nothing makes symptoms better or worse.     Past Medical History:  Diagnosis Date  . Adrenal insufficiency (Addison's disease) (HCC)   . Chronic UTI   . Colon polyps   . Depression   . Head ache   . Hyperlipidemia   . Lichen sclerosus of female genitalia   . Malabsorption   . Migraine headache   . Urticaria     Patient Active Problem List   Diagnosis Date Noted  . Jaw pain 03/01/2018  . Morbid obesity (H[facility]) 03/01/2018  . Eczema 11/30/2017  . Vitamin D deficiency 11/30/2017  . Adrenal insufficiency ([facility]) 11/10/2017  . Polyp of colon 11/10/2017  . Chronic urticaria 11/10/2017  . Migraine 11/10/2017  . PCOS (polycystic ovarian syndrome) 11/10/2017  . History of anaphylaxis 11/10/2017  . Attention deficit hyperactivity disorder (ADHD) 11/10/2017  . History of depression 11/10/2017  . Allergic rhinitis 11/10/2017    Past Surgical History:  Procedure Laterality Date  . CHOLECYSTECTOMY  2013  . KIDNEY STONE SURGERY       OB History   No obstetric history on file.      Home Medications    Prior to Admission medications   Medication Sig Start Date End Date Taking? Authorizing Provider  atomoxetine (STRATTERA) 60 MG capsule  09/19/17  Yes [provider]  buPROPion (WELLBUTRIN XL) 300 MG 24 hr tablet 300 mg.  09/15/17  Yes [provider]   butalbital-acetaminophen-caffeine (FIORICET, ESGIC) 50-325-40 MG tablet Take by mouth 2 (two) times daily as needed for headache.   Yes [provider]  cetirizine (ZYRTEC) 10 MG tablet Take 10 mg by mouth daily.   Yes [provider]  diazepam (VALIUM) 5 MG tablet Take 2.5 mg by mouth every 6 (six) hours as needed for anxiety.   Yes [provider]  escitalopram (LEXAPRO) 20 MG tablet Take 20 mg by mouth daily.   Yes [provider]  triamcinolone ointment (KENALOG) 0.5 % Apply 1 application topically 2 (two) times daily. 11/30/17  Yes Ardith Dark, MD  Vitamin D, Ergocalciferol, (DRISDOL) 1.25 MG (50000 UT) CAPS capsule Take 1 capsule (50,000 Units total) by mouth every 7 (seven) days. 12/01/18  Yes Ardith Dark, MD  EPINEPHrine 0.3 mg/0.3 mL IJ SOAJ injection Inject 0.3 mg into the muscle once.    [provider]    Family History Family History  Problem Relation Age of Onset  . Diabetes Mother   . Hyperlipidemia Mother   . Hypertension Mother   . Miscarriages / [place] Mother   . Hyperlipidemia Daughter   . COPD Maternal Grandfather   . Hypertension Maternal Grandfather   . Heart disease Maternal Grandfather   . Hypertension Paternal Grandfather   . Heart attack Paternal Grandfather     Social History Social History   Tobacco Use  .  Smoking status: Never Smoker  . Smokeless tobacco: Never Used  Substance Use Topics  . Alcohol use: Never    Frequency: Never  . Drug use: Never     Allergies   Banana, Ceclor [cefaclor], Venomil wasp venom [wasp venom], and Nickel   Review of Systems Review of Systems  All other systems reviewed and are negative.    Physical Exam Updated Vital Signs BP (!) 144/85 (BP Location: Right Arm) Comment: Simultaneous filing. User may not have seen previous data.  Pulse 100 Comment: Simultaneous filing. User may not have seen previous data.  Temp 98.3 F (36.8 C) (Oral)   Resp 18   LMP  05/21/2019   SpO2 100% Comment: Simultaneous filing. User may not have seen previous data.  Physical Exam Vitals signs and nursing note reviewed.  Constitutional:      Appearance: She is well-developed.  HENT:     Head: Normocephalic and atraumatic.  Eyes:     Conjunctiva/sclera: Conjunctivae normal.  Neck:     Musculoskeletal: Neck supple.  Cardiovascular:     Rate and Rhythm: Normal rate and regular rhythm.  Pulmonary:     Effort: Pulmonary effort is normal.     Breath sounds: Normal breath sounds.  Abdominal:     General: Bowel sounds are normal.     Palpations: Abdomen is soft.     Comments: Minimal tenderness right mid lateral abdomen  Genitourinary:    Comments: Rectal exam: No masses.  Heme-negative. Musculoskeletal: Normal range of motion.  Skin:    General: Skin is warm and dry.  Neurological:     Mental Status: She is alert and oriented to person, place, and time.  Psychiatric:        Behavior: Behavior normal.      ED Treatments / Results  Labs (all labs ordered are listed, but only abnormal results are displayed) Labs Reviewed  COMPREHENSIVE METABOLIC PANEL - Abnormal; Notable for the following components:      Result Value   Glucose, Bld 103 (*)    All other components within normal limits  POC OCCULT BLOOD, ED - Abnormal; Notable for the following components:   Fecal Occult Bld POSITIVE (*)    All other components within normal limits  CBC  I-STAT BETA HCG BLOOD, ED ([facility], [facility], [facility] ONLY)  TYPE AND SCREEN  ABO/RH    EKG None  Radiology No results found.  Procedures Procedures (including critical care time)  Medications Ordered in ED Medications  sodium chloride 0.9 % bolus 1,000 mL (has no administration in time range)  fentaNYL (SUBLIMAZE) injection 50 mcg (has no administration in time range)     Initial Impression / Assessment and Plan / ED Course  I have reviewed the triage vital signs and the nursing notes.  Pertinent labs & imaging  results that were available during my care of the patient were reviewed by me and considered in my medical decision making (see chart for details).        Patient presents with rectal bleeding.  She has had several CT scans in the past.  We are going to hydrate her and check her hemoglobin.  If stable, will refer to GI.  Otherwise, obtain CT of abdomen pelvis.  Final Clinical Impressions(s) / ED Diagnoses   Final diagnoses:  Rectal bleeding    ED Discharge Orders    None       Nat Christen, MD 05/28/19 [phone]

## 2019-05-28 NOTE — Telephone Encounter (Signed)
Patient is calling to report she has started bleeding with bowel movements and today she had bleeding from rectum without bowel movement. Due to her symptoms- protocol advises ED for evaluation of rectal bleeding.  Reason for Disposition . [1] MODERATE rectal bleeding (small blood clots, passing blood without stool, or toilet water turns red) AND [2] more than once a day  Answer Assessment - Initial Assessment Questions 1. APPEARANCE of BLOOD: "What color is it?" "Is it passed separately, on the surface of the stool, or mixed in with the stool?"      Bright red, on the surface of stool 2. AMOUNT: "How much blood was passed?"      Clot-and heavy at first- now- red tinge to water 3. FREQUENCY: "How many times has blood been passed with the stools?"      4 times with and once without 4. ONSET: "When was the blood first seen in the stools?" (Days or weeks)      Yesterday 11:00am 5. DIARRHEA: "Is there also some diarrhea?" If so, ask: "How many diarrhea stools were passed in past 24 hours?"      No diarrhea 6. CONSTIPATION: "Do you have constipation?" If so, "How bad is it?"     No constipation 7. RECURRENT SYMPTOMS: "Have you had blood in your stools before?" If so, ask: "When was the last time?" and "What happened that time?"      Yes- pre cancer and rectal bleeding in the past-coloscopy  8. BLOOD THINNERS: "Do you take any blood thinners?" (e.g., Coumadin/warfarin, Pradaxa/dabigatran, aspirin)     no 9. OTHER SYMPTOMS: "Do you have any other symptoms?"  (e.g., abdominal pain, vomiting, dizziness, fever)     Abdominal pain yesterday and lower back pain today 10. PREGNANCY: "Is there any chance you are pregnant?" "When was your last menstrual period?"       No- not active- LMP- last week  Protocols used: RECTAL BLEEDING-A-AH

## 2019-05-28 NOTE — ED Notes (Signed)
Pt verbalized discharge instructions and follow up care. Alert and ambulatory. No IV.  

## 2019-05-30 ENCOUNTER — Telehealth: Payer: Self-pay | Admitting: Gastroenterology

## 2019-05-30 NOTE — Telephone Encounter (Signed)
Left voicemail for patient to call back and schedule appointment.

## 2019-05-30 NOTE — Telephone Encounter (Signed)
ED sent me a message that patient was there this week for rectal bleeding.  Needs next available new patient appointment with any provider.

## 2019-06-04 ENCOUNTER — Encounter: Payer: Self-pay | Admitting: Gastroenterology

## 2019-06-14 ENCOUNTER — Encounter: Payer: Self-pay | Admitting: Physician Assistant

## 2019-06-14 ENCOUNTER — Other Ambulatory Visit: Payer: Self-pay

## 2019-06-14 ENCOUNTER — Ambulatory Visit (INDEPENDENT_AMBULATORY_CARE_PROVIDER_SITE_OTHER): Payer: Commercial Managed Care - PPO | Admitting: Physician Assistant

## 2019-06-14 VITALS — BP 128/86 | HR 106 | Temp 97.8°F | Ht 63.0 in | Wt 308.0 lb

## 2019-06-14 DIAGNOSIS — R1030 Lower abdominal pain, unspecified: Secondary | ICD-10-CM

## 2019-06-14 NOTE — Patient Instructions (Signed)
If you are age 27 or older, your body mass index should be between 23-30. Your Body mass index is 54.56 kg/m. If this is out of the aforementioned range listed, please consider follow up with your Primary Care Provider.  If you are age 63 or younger, your body mass index should be between 19-25. Your Body mass index is 54.56 kg/m. If this is out of the aformentioned range listed, please consider follow up with your Primary Care Provider.   You have been scheduled for a CT scan of the abdomen and pelvis at [facility] ([address]---this is in the same building as [employer]).   You are scheduled on 06/21/2019 at [facility] should arrive 15 minutes prior to your appointment time for registration. Please follow the written instructions below on the day of your exam:  WARNING: IF YOU ARE ALLERGIC TO IODINE/X-RAY DYE, PLEASE NOTIFY RADIOLOGY IMMEDIATELY AT [phone]! YOU WILL BE GIVEN A 13 HOUR PREMEDICATION PREP.  1) Do not eat or drink anything after 7am (4 hours prior to your test) 2) You have been given 2 bottles of oral contrast to drink. The solution may taste better if refrigerated, but do NOT add ice or any other liquid to this solution. Shake well before drinking.    Drink 1 bottle of contrast @ 9am (2 hours prior to your exam)  Drink 1 bottle of contrast @ 10am (1 hour prior to your exam)  You may take any medications as prescribed with a small amount of water, if necessary. If you take any of the following medications: METFORMIN, GLUCOPHAGE, GLUCOVANCE, AVANDAMET, RIOMET, FORTAMET, [place] MET, JANUMET, GLUMETZA or METAGLIP, you MAY be asked to HOLD this medication 48 hours AFTER the exam.  The purpose of you drinking the oral contrast is to aid in the visualization of your intestinal tract. The contrast solution may cause some diarrhea. Depending on your individual set of symptoms, you may also receive an intravenous injection of x-ray contrast/dye. Plan on  being at [facility] for 30 minutes or longer, depending on the type of exam you are having performed.  This test typically takes 30-45 minutes to complete.  If you have any questions regarding your exam or if you need to reschedule, you may call the CT department at [phone] between the hours of 8:00 am and 5:00 pm, Monday-Friday.  ________________________________________________________________________

## 2019-06-14 NOTE — Progress Notes (Signed)
Subjective:    Patient ID: Kaitlin Robles, female    DOB: 12/25/1990, 28 y.o.   MRN: [id number]  HPI [name] is a pleasant 28 year old white female, new to GI today, after recent ER visit on 05/28/2019. She presented at that time with rectal bleeding, and says she was having bright red blood with her bowel movements and was also seeing some bright red blood from the rectum when she would urinate.  This occurred for about 24 hours then resolved and has not recurred.  She had no associated abdominal pain or rectal pain, had not had any change in bowel habits etc. Patient relates she has had prior GI evaluation while living in [place].  I can see some of those records which show colonoscopy in September 2015 through a Dr. Allena KatzPatel at [facility] which showed a 4 mm polyp which bowel biopsy was a tubular adenoma, she also had random biopsies done which were negative and biopsy from the terminal ileum negative.  She was noted to have small internal hemorrhoids. Patient tells me that she had follow-up colonoscopy in 2018 through that same practice and was told this was negative. She also had EGD in 2015 which was negative, biopsies for H. pylori negative, no evidence of eosinophilic esophagitis and small bowel biopsies negative. Patient tells me she was diagnosed with SIBO by breath testing and given a couple of courses of antibiotics.  Apparently she had persistence of symptoms and was eventually referred to [facility].  She says she waited about 6 months for that appointment and when she was seen there she was told that her symptoms were probably secondary to anxiety and was not offered any other treatment. After that she saw a naturopath and a functional doctor.  She says she had a lot of testing done for food allergies etc. Did a lot of food elimination without any improvement in symptoms, and gradually symptoms resolved. She says she had felt okay from that standpoint until about 2 weeks ago when  she developed some recurrent abdominal pain and bloating with a tight feeling in her abdomen.  She says this is reminiscent of how she felt with the SIBO in the past but not as quite as bad as it had been.  She describes some migratory abdominal pain   And discomfort and nausea.  Bowel movements have been normal.  Review of Systems Pertinent positive and negative review of systems were noted in the above HPI section.  All other review of systems was otherwise negative.  Outpatient Encounter Medications as of 06/14/2019  Medication Sig  . atomoxetine (STRATTERA) 60 MG capsule   . buPROPion (WELLBUTRIN XL) 300 MG 24 hr tablet 300 mg.   . cetirizine (ZYRTEC) 10 MG tablet Take 10 mg by mouth daily.  . diazepam (VALIUM) 5 MG tablet Take 2.5 mg by mouth every 6 (six) hours as needed for anxiety.  Marland Kitchen. EPINEPHrine 0.3 mg/0.3 mL IJ SOAJ injection Inject 0.3 mg into the muscle once.  . escitalopram (LEXAPRO) 20 MG tablet Take 20 mg by mouth daily.  Marland Kitchen. triamcinolone ointment (KENALOG) 0.5 % Apply 1 application topically 2 (two) times daily.  . Vitamin D, Ergocalciferol, (DRISDOL) 1.25 MG (50000 UT) CAPS capsule Take 1 capsule (50,000 Units total) by mouth every 7 (seven) days.  . [DISCONTINUED] butalbital-acetaminophen-caffeine (FIORICET, ESGIC) 50-325-40 MG tablet Take by mouth 2 (two) times daily as needed for headache.   No facility-administered encounter medications on file as of 06/14/2019.    Allergies  Allergen Reactions  . Banana Nausea Only  . Ceclor [Cefaclor] Hives  . Venomil Wasp Venom [Wasp Venom] Anaphylaxis  . Nickel Dermatitis   Patient Active Problem List   Diagnosis Date Noted  . Jaw pain 03/01/2018  . Morbid obesity ([place]) 03/01/2018  . Eczema 11/30/2017  . Vitamin D deficiency 11/30/2017  . Adrenal insufficiency ([place]) 11/10/2017  . Polyp of colon 11/10/2017  . Chronic urticaria 11/10/2017  . Migraine 11/10/2017  . PCOS (polycystic ovarian syndrome) 11/10/2017  . History of  anaphylaxis 11/10/2017  . Attention deficit hyperactivity disorder (ADHD) 11/10/2017  . History of depression 11/10/2017  . Allergic rhinitis 11/10/2017   Social History   Socioeconomic History  . Marital status: Single    Spouse name: Not on file  . Number of children: Not on file  . Years of education: Not on file  . Highest education level: Not on file  Occupational History  . Not on file  Social Needs  . Financial resource strain: Not on file  . Food insecurity    Worry: Not on file    Inability: Not on file  . Transportation needs    Medical: Not on file    Non-medical: Not on file  Tobacco Use  . Smoking status: Never Smoker  . Smokeless tobacco: Never Used  Substance and Sexual Activity  . Alcohol use: Never    Frequency: Never  . Drug use: Never  . Sexual activity: Not Currently  Lifestyle  . Physical activity    Days per week: Not on file    Minutes per session: Not on file  . Stress: Not on file  Relationships  . Social Herbalist on phone: Not on file    Gets together: Not on file    Attends religious service: Not on file    Active member of club or organization: Not on file    Attends meetings of clubs or organizations: Not on file    Relationship status: Not on file  . Intimate partner violence    Fear of current or ex partner: Not on file    Emotionally abused: Not on file    Physically abused: Not on file    Forced sexual activity: Not on file  Other Topics Concern  . Not on file  Social History Narrative  . Not on file    Ms. Sangha's family history includes COPD in her maternal grandfather; Colon polyps in her brother; Diabetes in her mother; Heart attack in her paternal grandfather; Heart disease in her maternal grandfather; Hyperlipidemia in her daughter and mother; Hypertension in her maternal grandfather, mother, and paternal grandfather; 75 / [place] in her mother.      Objective:    Vitals:   06/14/19 1428   BP: 128/86  Pulse: (!) 106  Temp: 97.8 F (36.6 C)    Physical Exam Well-developed well-nourished young white female in no acute distress.  Height, Weight, 308 BMI 54.5  HEENT; nontraumatic normocephalic, EOMI, PER R LA, sclera anicteric. Oropharynx; not examined/mask/Covid Neck; supple, no JVD Cardiovascular; regular rate and rhythm with S1-S2, no murmur rub or gallop Pulmonary; Clear bilaterally Abdomen; soft, morbidly obese is mildly tender across the lower abdomen with palpation.  She does have a significant panniculus there is no evidence of cellulitis.,  She also has rather generalized mild tenderness with no guarding nondistended, no palpable mass or hepatosplenomegaly, bowel sounds are active Rectal; not done Skin; benign exam, no jaundice rash or appreciable lesions Extremities;  no clubbing cyanosis or edema skin warm and dry Neuro/Psych; alert and oriented x4, grossly nonfocal mood and affect appropriate       Assessment & Plan:   #58 28 year old white female, with recent ER visit with hematochezia.  Symptoms lasted about 24 hours then resolved and have not recurred. Labs were unremarkable with hemoglobin of 13.1. Prior colonoscopy from 2015 had shown small internal hemorrhoids and suspect her recent bleeding was secondary to internal hemorrhoids.  #2 History of adenomatous colon polyp 2015, patient states she had follow-up colonoscopy in 2018 also done in [place] and was told this was negative.  Will obtain those records.  #3 prior history of SIBO by patient report documented with breath testing.  By patient report had a prolonged course and did not respond to initial antibiotics.  #4  2-week history of primarily lower abdominal pain bloating, some distention and nausea.  Etiology not clear.  I am not certain this is related to SIBO.  She is fairly significantly tender on abdominal exam.  Will rule out other intra-abdominal inflammatory process, also consider SIBO  and IBS. #5 morbid obesity #6.  PCOS #7.  ADD  Plan; Schedule for CT scan of the abdomen and pelvis with contrast. If CT unremarkable will proceed with breath testing for SIBO. Patient is signed a release and will obtain all of her records from prior GI work-up in [place]. Patient will be established with Dr. Myrtie Neither.   Lashae Wollenberg Oswald Hillock PA-C 06/14/2019   Cc: Ardith Dark, MD

## 2019-06-18 NOTE — Progress Notes (Signed)
____________________________________________________________  Attending physician addendum:  Thank you for sending this case to me. I have reviewed the entire note, and the outlined plan seems appropriate.  Henry Danis, MD  ____________________________________________________________  

## 2019-06-21 ENCOUNTER — Other Ambulatory Visit: Payer: Self-pay

## 2019-06-21 ENCOUNTER — Ambulatory Visit (INDEPENDENT_AMBULATORY_CARE_PROVIDER_SITE_OTHER)
Admission: RE | Admit: 2019-06-21 | Discharge: 2019-06-21 | Disposition: A | Discharge status: Home / Self Care | Payer: Commercial Managed Care - PPO | Source: Ambulatory Visit | Attending: Physician Assistant | Admitting: Physician Assistant

## 2019-06-21 DIAGNOSIS — R1030 Lower abdominal pain, unspecified: Secondary | ICD-10-CM

## 2019-06-21 MED ORDER — IOHEXOL 300 MG/ML  SOLN
100.0000 mL | Freq: Once | INTRAMUSCULAR | Status: AC | PRN
  Administered 2019-06-21: 100 mL via INTRAVENOUS

## 2019-06-25 ENCOUNTER — Ambulatory Visit: Payer: Commercial Managed Care - PPO | Admitting: Internal Medicine

## 2019-06-25 ENCOUNTER — Encounter: Payer: Self-pay | Admitting: Family Medicine

## 2019-06-25 ENCOUNTER — Encounter: Payer: Self-pay | Admitting: Internal Medicine

## 2019-06-25 ENCOUNTER — Ambulatory Visit (INDEPENDENT_AMBULATORY_CARE_PROVIDER_SITE_OTHER): Payer: Commercial Managed Care - PPO | Admitting: Internal Medicine

## 2019-06-25 ENCOUNTER — Other Ambulatory Visit: Payer: Self-pay

## 2019-06-25 VITALS — BP 128/60 | HR 104 | Ht 63.0 in | Wt 309.0 lb

## 2019-06-25 DIAGNOSIS — Z872 Personal history of diseases of the skin and subcutaneous tissue: Secondary | ICD-10-CM | POA: Insufficient documentation

## 2019-06-25 DIAGNOSIS — F458 Other somatoform disorders: Secondary | ICD-10-CM | POA: Insufficient documentation

## 2019-06-25 DIAGNOSIS — E282 Polycystic ovarian syndrome: Secondary | ICD-10-CM

## 2019-06-25 DIAGNOSIS — Z8639 Personal history of other endocrine, nutritional and metabolic disease: Secondary | ICD-10-CM | POA: Diagnosis not present

## 2019-06-25 DIAGNOSIS — N200 Calculus of kidney: Secondary | ICD-10-CM | POA: Insufficient documentation

## 2019-06-25 DIAGNOSIS — F418 Other specified anxiety disorders: Secondary | ICD-10-CM | POA: Insufficient documentation

## 2019-06-25 DIAGNOSIS — L900 Lichen sclerosus et atrophicus: Secondary | ICD-10-CM | POA: Insufficient documentation

## 2019-06-25 DIAGNOSIS — N83209 Unspecified ovarian cyst, unspecified side: Secondary | ICD-10-CM | POA: Insufficient documentation

## 2019-06-25 DIAGNOSIS — E559 Vitamin D deficiency, unspecified: Secondary | ICD-10-CM

## 2019-06-25 MED ORDER — METFORMIN HCL 1000 MG PO TABS: 1000.0000 mg | ORAL_TABLET | Freq: Two times a day (BID) | ORAL | 3 refills | Status: DC

## 2019-06-25 NOTE — Progress Notes (Signed)
Patient ID: Kaitlin Robles, female   DOB: [birth year], 28 y.o.   MRN: [id number]    HPI  Kaitlin Robles is a 28 y.o.-year-old female, returning for follow-up for history of adrenal insufficiency and PCOS. She moved to [place] in 05/2017.  Last visit 1 year ago.  She feels she gained weight since last visit.  She established care with the weight loss center and has her first appointment tomorrow.  Reviewed pertinent PMH: Patient describes that she has a history of many courses of prednisone for anaphylaxis to fire aunts bites in 2014, when she was living in [place], after which she started to see endocrinology for severe fatigue (she could not keep a full-time job), weight gain of 80 pounds, and also irregular menstrual cycles.   She was diagnosed with PCOS and started on metformin. She does not feel that this is helping.  However, at that time, as she felt that she was not getting all her answers from endocrinology, she started to see integrative medicine.  Pt. has been found to have a low cortisol level in 2015.  This was only followed initially but she had another low cortisol level in 02/2017 (see scanned results below).  At that time,  she was started on hydrocortisone 20 mg a day (10 + 5 + 5 - she was usually forgetting the last dose of the day).  She was on this dose for approximately 1.5 years, after which she started to decrease by herself. At last visit with PCP she was taking 5 mg a day (started 10/2017)  -she remembers that she had flulike symptoms, fatigue, rash for 3 days,  but this resolved.  She is now feeling well, without significant fatigue.  She is interested in coming off hydrocortisone.  She also describes that she is exercising consistently (starting 05/2017).  She gained almost 80 pounds after her steroid treatments and she is trying to lose weight. She actually lost weight in college, when she was cooking all meals, not eating processed foods.  Regarding her PCOS, she describes having  occasional irregular menstrual cycles in the past (but definitely more than 4 cycles a year), but she has been on OCPs (Junel) since diagnosis of PCOS, in 2014.  She is interested in coming off this medication also.  She had 3 ruptured cysts in last year - last in 05/2017.  She is also on metformin ER 500 mg twice a day but she does not think that this is helping anything.  She is also interested in stopping this medication. No OCP needed for protection - she is in a lesbian relationship. She does not have increased acne or hirsutism.  No h/o Depo-provera, Megace, po ketoconazole, phenytoin, rifampin, chronic fluconazole use. No h/o autoimmune diseases in pt. No excess use of NSAIDs. No h/o generalized infections or HIV. No IVDA. No h/o head injury or severe HA. No h/o malignancy. Had a precancerous colon polyp removed. Now q5 years colonoscopy.  Of note,  She has a history of SIBO (diagnosed around 2015). This improved but feels this has recurred recently. She will have another breath test.  In 12/2017, I reviewed her previous adrenal labs, TFTs, prolactin level, and also checked a cosyntropin stimulation test.  All the results were normal.  Therefore, we stopped hydrocortisone.  She did not feel a difference after stopping this medication.  In 12/2017, we also stopped her oral contraceptives and metformin since she did not feel that these are helping and she was having at least  4 menstrual cycles a year.  Her menstrual cycles are regular now - every month!  Retrospectively, she feels that the Metformin may have helped her weight.  I reviewed patient's cortisol levels from 02/28/2017 (ordered by Dr. Leafy HalfJohn Hart) - [facility]:   A.m. cortisol level was normal as was a cosyntropin stimulation test: Component     Latest Ref Rng & Units 07/06/2018  Cortisol, Plasma     ug/dL 4.5   Component     Latest Ref Rng & Units 11/10/2017  Cortisol, Plasma     ug/dL 9.7   Component     Latest Ref  Rng & Units 01/10/2018 01/10/2018 01/10/2018         8:17 AM  8:55 AM  9:23 AM  Cortisol, Plasma     ug/dL 91.412.2 78.225.5 95.630.0   DHEA-S was normal, and 89.  Thyroid test and prolactin were normal Lab Results  Component Value Date   TSH 2.51 11/10/2017   Lab Results  Component Value Date   FREET4 0.68 11/10/2017   T3FREE 3.9 11/10/2017   Lab Results  Component Value Date   PROLACTIN 15.5 11/10/2017   Latest testosterone level was normal, OCPs: Component     Latest Ref Rng & Units 07/06/2018          Testosterone, Serum (Total)     ng/dL 18  % Free Testosterone     % 1.7  Free Testosterone, S     pg/mL 3.1  Sex Hormone Binding Globulin     nmol/L 28.8   Previously: Component     Latest Ref Rng & Units 11/10/2017  Testosterone, Total, LC-MS-MS     2 - 45 ng/dL 30  Free Testosterone     0.1 - 6.4 pg/mL 2.0  Sex Horm Binding Glob, Serum     17 - 124 nmol/L 107   No history of hyponatremia with hyperkalemia:    Chemistry      Component Value Date/Time   NA 137 05/28/2019 1306   NA 139 10/18/2018   K 4.1 05/28/2019 1306   CL 103 05/28/2019 1306   CO2 27 05/28/2019 1306   BUN 9 05/28/2019 1306   BUN 5 10/18/2018   CREATININE 0.83 05/28/2019 1306   GLU 79 10/18/2018      Component Value Date/Time   CALCIUM 9.3 05/28/2019 1306   ALKPHOS 77 05/28/2019 1306   AST 20 05/28/2019 1306   ALT 19 05/28/2019 1306   BILITOT 0.8 05/28/2019 1306     She also has a history of hypertriglyceridemia, chronic urticaria, lichen sclerosus, ADHD.  She had kidney stone removal in 2013.   Lipids: 05/09/2019: 233/336/40/133 Lab Results  Component Value Date   CHOL 212 (A) 10/19/2018   HDL 39 10/19/2018   LDLCALC 120 10/19/2018   LDLDIRECT 131.0 11/10/2017   TRIG 264 (A) 10/19/2018   CHOLHDL 4 11/10/2017   (11/21/2017): 256/428/48/n/c  HbA1c: 05/09/2019: 5.2% No results found for: HGBA1C   She also has a history of vitamin D deficiency.  Latest vitamin D level:  05/09/2019: vit D 24.3 Lab Results  Component Value Date   VD25OH 31.1 10/19/2018   VD25OH 25 08/10/2017   VD25OH 19 04/14/2017   VD25OH 18.6 02/22/2017   We started 4000 units vitamin D daily.  She also has increased LNs.  ROS: Constitutional: + weight gain/no weight loss, no fatigue, no subjective hyperthermia, no subjective hypothermia Eyes: no blurry vision, no xerophthalmia ENT: no sore throat, + nodules palpated in  neck, no dysphagia, no odynophagia, no hoarseness Cardiovascular: no CP/no SOB/no palpitations/no leg swelling Respiratory: no cough/no SOB/no wheezing Gastrointestinal: no N/no V/no D/no C/no acid reflux Musculoskeletal: no muscle aches/no joint aches Skin: no rashes, no hair loss Neurological: no tremors/no numbness/no tingling/no dizziness  I reviewed pt's medications, allergies, PMH, social hx, family hx, and changes were documented in the history of present illness. Otherwise, unchanged from my initial visit note.  Past Medical History:  Diagnosis Date  . Adrenal insufficiency (Addison's disease) ([place])   . Anxiety disorder   . Chronic UTI   . Colon polyps   . Depression   . Head ache   . Hyperlipidemia   . Lichen sclerosus of female genitalia   . Malabsorption   . Migraine headache   . Obesity   . Urticaria    Past Surgical History:  Procedure Laterality Date  . COLONOSCOPY    . KIDNEY STONE SURGERY  2013   Social History   Socioeconomic History  . Marital status: Single    Spouse name: Not on file  . Number of children: 0  . Years of education: Not on file  . Highest education level: Not on file  Occupational History  .  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 Financial resource strain: Not on file  . Food insecurity:    Worry: Not on file    Inability: Not on file  . Transportation needs:    Medical: Not on file    Non-medical: Not on file  Tobacco Use  . Smoking status: Never Smoker  . Smokeless tobacco: Never  Used  Substance and Sexual Activity  . Alcohol use: Never    Frequency: Never  . Drug use: Never   Current Outpatient Medications on File Prior to Visit  Medication Sig Dispense Refill  . atomoxetine (STRATTERA) 60 MG capsule     . buPROPion (WELLBUTRIN XL) 300 MG 24 hr tablet 300 mg.     . cetirizine (ZYRTEC) 10 MG tablet Take 10 mg by mouth daily.    . diazepam (VALIUM) 5 MG tablet Take 2.5 mg by mouth every 6 (six) hours as needed for anxiety.    Marland Kitchen EPINEPHrine 0.3 mg/0.3 mL IJ SOAJ injection Inject 0.3 mg into the muscle once.    . escitalopram (LEXAPRO) 20 MG tablet Take 20 mg by mouth daily.    Marland Kitchen triamcinolone ointment (KENALOG) 0.5 % Apply 1 application topically 2 (two) times daily. 30 g 0  . Vitamin D, Ergocalciferol, (DRISDOL) 1.25 MG (50000 UT) CAPS capsule Take 1 capsule (50,000 Units total) by mouth every 7 (seven) days. 12 capsule 3   No current facility-administered medications on file prior to visit.    Allergies  Allergen Reactions  . Banana Nausea Only  . Ceclor [Cefaclor] Hives  . Venomil Wasp Venom [Wasp Venom] Anaphylaxis  . Nickel Dermatitis   Family History  Problem Relation Age of Onset  . Diabetes Mother   . Hyperlipidemia Mother   . Hypertension Mother   . Miscarriages / [place] Mother   . Colon polyps Brother   . Hyperlipidemia Daughter   . COPD Maternal Grandfather   . Hypertension Maternal Grandfather   . Heart disease Maternal Grandfather   . Hypertension Paternal Grandfather   . Heart attack Paternal Grandfather   . Esophageal cancer Neg Hx   . Stomach cancer Neg Hx   . Colon cancer Neg Hx   . Pancreatic cancer Neg Hx  PE: BP 128/60   Pulse (!) 104   Ht 5\' 3"  (1.6 m)   Wt (!) 309 lb (140.2 kg)   LMP 06/15/2019   SpO2 99%   BMI 54.74 kg/m   Wt Readings from Last 3 Encounters:  06/25/19 (!) 309 lb (140.2 kg)  06/14/19 (!) 308 lb (139.7 kg)  08/21/18 (!) 303 lb 6.4 oz (137.6 kg)   Constitutional: overweight, in NAD Eyes:  PERRLA, EOMI, no exophthalmos ENT: moist mucous membranes, no thyromegaly, no cervical lymphadenopathy Cardiovascular: Tachycardia, RR, No MRG Respiratory: CTA B Gastrointestinal: abdomen soft, NT, ND, BS+ Musculoskeletal: no deformities, strength intact in all 4 Skin: moist, warm, no rashes Neurological: no tremor with outstretched hands, DTR normal in all 4 ASSESSMENT: 1.  Resolved adrenal insufficiency  2. PCOS  3. Vit D def.  PLAN:  1.  History of adrenal insufficiency (AI) -Resolved -She was diagnosed with adrenal insufficiency based on salivary cortisol results after urine which she had prednisone courses for anaphylaxis to fire ant stings.  She probably had a low adrenal cortisol production at that time due to the inhibitor effect of exogenous prednisone to the hypothalamus.   -In the last years, she was able to decrease the hydrocortisone dose from 15-20 mg daily, which she is due for approximately 1.5 years, to only 5 mg daily and was feeling well on this dose.  We checked a cosyntropin stimulation test and this was normal so I advised her to stop the medication but keep it at hand just in case she develops adrenal insufficiency symptoms.  She did not have to restart since last visit.  At last visit, cortisol level was normal.  We would not repeat this today. -She is aware that she needs to try to avoid steroid courses as much as possible  2. PCOS -She was diagnosed with PCOS in 2014 due to menstrual irregularity.  I do not have the results of this investigation -She does have a history of weight gain, ovarian cyst, and likely, insulin resistance.  She was started on Metformin by her integrative medicine physician after an insulin level returned elevated. She stopped Metformin last year >> gained weight: 9 lbs since last OV -She was on OCPs for 4 years before but came off.  Her menstrual cycles are regular, coming monthly, now, for the first time in her life.  She is very happy  about this! -At last visit, testosterone level was normal: Component     Latest Ref Rng & Units 07/06/2018          Testosterone, Serum (Total)     ng/dL 18  % Free Testosterone     % 1.7  Free Testosterone, S     pg/mL 3.1  Sex Hormone Binding Globulin     nmol/L 28.8  -At last visit we discussed about the importance of losing weight.  She started to exercise and I also advised her to buy a scale.  She gained 9 pounds since last visit but has an appointment with the weight loss specialist later this week. -We did discuss about restarting Metformin and she would like to try this.  I advised her to do so only after her appointment with the weight loss specialist. -Latest HbA1c was 5.2% less than 2 months ago.  Would not repeat this today  3. Vit D def -In 06/2018, vitamin D was 19.4, low, then normalized to 10/2018.  She had another normal level in 04/2019 -We started 4000 units vitamin D daily  -  we will continue this    Carlus Pavlov, MD PhD [facility] Endocrinology

## 2019-06-25 NOTE — Patient Instructions (Addendum)
  Please restart Metformin 500 mg with dinner x 3 days, then increase to 500 mg 2x a day for 3 days then increase to 1000 mg 2x a day.  Please start 4000 units vitamin D daily.  Please come back for a follow-up appointment in 1 year.

## 2019-06-26 ENCOUNTER — Encounter (INDEPENDENT_AMBULATORY_CARE_PROVIDER_SITE_OTHER): Payer: Self-pay | Admitting: Family Medicine

## 2019-06-26 ENCOUNTER — Telehealth: Payer: Self-pay | Admitting: Physician Assistant

## 2019-06-26 ENCOUNTER — Ambulatory Visit (INDEPENDENT_AMBULATORY_CARE_PROVIDER_SITE_OTHER): Payer: Commercial Managed Care - PPO | Admitting: Family Medicine

## 2019-06-26 ENCOUNTER — Other Ambulatory Visit: Payer: Self-pay

## 2019-06-26 ENCOUNTER — Encounter: Payer: Self-pay | Admitting: Family Medicine

## 2019-06-26 VITALS — BP 127/84 | HR 102 | Temp 98.0°F | Ht 63.0 in | Wt 306.0 lb

## 2019-06-26 DIAGNOSIS — R5383 Other fatigue: Secondary | ICD-10-CM | POA: Diagnosis not present

## 2019-06-26 DIAGNOSIS — Z6841 Body Mass Index (BMI) 40.0 and over, adult: Secondary | ICD-10-CM

## 2019-06-26 DIAGNOSIS — R0602 Shortness of breath: Secondary | ICD-10-CM | POA: Diagnosis not present

## 2019-06-26 DIAGNOSIS — Z9189 Other specified personal risk factors, not elsewhere classified: Secondary | ICD-10-CM | POA: Diagnosis not present

## 2019-06-26 DIAGNOSIS — E559 Vitamin D deficiency, unspecified: Secondary | ICD-10-CM

## 2019-06-26 DIAGNOSIS — Z0289 Encounter for other administrative examinations: Secondary | ICD-10-CM

## 2019-06-26 DIAGNOSIS — F3289 Other specified depressive episodes: Secondary | ICD-10-CM | POA: Diagnosis not present

## 2019-06-26 DIAGNOSIS — E282 Polycystic ovarian syndrome: Secondary | ICD-10-CM

## 2019-06-26 DIAGNOSIS — R109 Unspecified abdominal pain: Secondary | ICD-10-CM

## 2019-06-26 NOTE — Progress Notes (Signed)
Office: [phone]  /  Fax: [phone]   Dear Dr. Jacquiline Robles,   Thank you for referring Kaitlin Robles to our clinic. The following note includes my evaluation and treatment recommendations.  HPI:   Chief Complaint: OBESITY    Kaitlin Robles has been referred by Dr. Jacquiline Robles for consultation regarding her obesity and obesity related comorbidities.    Kaitlin Robles (MR# [id number]) is a 28 y.o. female who presents on 06/26/2019 for obesity evaluation and treatment. Current BMI is Body mass index is 54.21 kg/m.Marland Kitchen Kaitlin Robles has been struggling with her weight for many years and has been unsuccessful in either losing weight, maintaining weight loss, or reaching her healthy weight goal.     Kaitlin Robles states she is currently in the action stage of change and ready to dedicate time achieving and maintaining a healthier weight. Kaitlin Robles is interested in becoming our patient and working on intensive lifestyle modifications including (but not limited to) diet, exercise and weight loss.    Kaitlin Robles states she thinks her family will eat healthier with her her desired weight loss is 56 to 126 lbs she has been heavy most of  her life she started gaining weight in the fall of 2014 her heaviest weight ever was 309 lbs. she skips dinner and lunch 1-3 times per week she is frequently drinking liquids with calories she sometimes makes poor food choices she sometimes has problems with excessive hunger  she has binge eating behaviors she struggles with emotional eating    Fatigue Kaitlin Robles feels her energy is lower than it should be. This has worsened with weight gain and has worsened recently. [name] denies daytime somnolence and admits to waking up still tired. Patient generally gets 6 hours of sleep per night, and states they generally have restful sleep. Snoring is present. Apneic episodes are not present. Epworth Sleepiness Score is 10.  Dyspnea on exertion Kaitlin Robles notes increasing shortness of breath with exercising  and seems to be worsening over time with weight gain. She notes getting out of breath sooner with activity than she used to. This has gotten worse recently. Natausha denies orthopnea.  Vitamin D deficiency Kaitlin Robles has a diagnosis of Vitamin D deficiency. She denies nausea, vomiting or muscle weakness.  Polycystic Ovarian Syndrome (PCOS) Kaitlin Robles has a diagnosis of PCOS and was previously on metformin, halting weight gain. She discontinued it 1 year ago.  Depression with emotional eating behaviors Kaitlin Robles is struggling with emotional eating and using food for comfort to the extent that it is negatively impacting her health. She often snacks when she is not hungry. Kaitlin Robles sometimes feels she is out of control and then feels guilty that she made poor food choices. She has been working on behavior modification techniques to help reduce her emotional eating and has been somewhat successful. Kaitlin Robles is followed by Psychiatry/therapy. She shows no sign of suicidal or homicidal ideations.  Depression Screen Kaitlin Robles's Food and Mood (modified PHQ-9) score was 13. Depression screen PHQ 2/9 06/26/2019  Decreased Interest 2  Down, Depressed, Hopeless 2  PHQ - 2 Score 4  Altered sleeping 1  Tired, decreased energy 1  Change in appetite 2  Feeling bad or failure about yourself  3  Trouble concentrating 1  Moving slowly or fidgety/restless 0  Suicidal thoughts 1  PHQ-9 Score 13  Difficult doing work/chores Very difficult   Abdominal Pain Kaitlin Robles has abdominal pain and has a SBO workup by Gastroenterology pending.  ASSESSMENT AND PLAN:  Other fatigue - Plan: EKG 12-Lead, CBC  w/Diff/Platelet, B12, T3, T4, free, TSH  Shortness of breath on exertion - Plan: Lipid Panel With LDL/HDL Ratio, Ambulatory referral to Neurology  Vitamin D deficiency - Plan: Vitamin D (25 hydroxy)  PCOS (polycystic ovarian syndrome) - Plan: Comprehensive Metabolic Panel (CMET), HgB A1c, Insulin, random  Other depression  Abdominal  pain, unspecified abdominal location  At risk for osteoporosis  Class 3 severe obesity with serious comorbidity and body mass index (BMI) of 50.0 to 59.9 in adult, unspecified obesity type (HCC)  PLAN:  Fatigue Kaitlin Robles was informed that her fatigue may be related to obesity, depression or many other causes. Labs will be ordered, and in the meanwhile Kaitlin Robles has agreed to work on diet, exercise and weight loss to help with fatigue. Proper sleep hygiene was discussed including the need for 7-8 hours of quality sleep each night. A sleep study was ordered based on symptoms and Epworth score.  Obstructive Sleep Apnea Risk Counseling Kaitlin Robles was given extended  (15 minutes) coronary artery disease prevention counseling today. She is 28 y.o. female and has risk factors for obstructive sleep apnea including obesity. We discussed intensive lifestyle modifications today with an emphasis on specific weight loss instructions and strategies.  Dyspnea on exertion Kaitlin Robles's shortness of breath appears to be obesity related and exercise induced. She has agreed to work on weight loss and gradually increase exercise to treat her exercise induced shortness of breath. If Kaitlin Robles follows our instructions and loses weight without improvement of her shortness of breath, we will plan to refer to pulmonology. We will monitor this condition regularly. Kaitlin Robles agrees to this plan.  Vitamin D Deficiency Kaitlin Robles was informed that low Vitamin D levels contributes to fatigue and are associated with obesity, breast, and colon cancer. She will have routine testing of Vitamin D and follow-up with our clinic in 2 weeks.  Polycystic Ovarian Syndrome (PCOS) Kaitlin Robles will have labs checked today and follow-up in 2 weeks for review of lab results.  Depression with Emotional Eating Behaviors We discussed behavior modification techniques today to help Kaitlin Robles deal with her emotional eating and depression. Kaitlin Robles will be referred to Dr. Dewaine CongerBarker, our  bariatric psychologist, for evaluation.  Depression Screen Kaitlin Robles had a moderately positive depression screening. Depression is commonly associated with obesity and often results in emotional eating behaviors. We will monitor this closely and work on CBT to help improve the non-hunger eating patterns.   Abdominal Pain We discussed with Kaitlin Robles we would monitor closely with her diet changes. Currently undergoing workup for SIBO. She will follow-up in 2 weeks to monitor her progress.  Obesity Kaitlin Robles is currently in the action stage of change and her goal is to continue with weight loss efforts. I recommend Kaitlin Robles begin the structured treatment plan as follows:  She has agreed to follow the Category 2 plan.  Kaitlin Robles has been instructed to eventually work up to a goal of 150 minutes of combined cardio and strengthening exercise per week for weight loss and overall health benefits. We discussed the following Behavioral Modification Strategies today: increasing lean protein intake, decreasing simple carbohydrates, increasing vegetables, increase H20 intake, decrease liquid calories, and decrease eating out.   She was informed of the importance of frequent follow-up visits to maximize her success with intensive lifestyle modifications for her multiple health conditions. She was informed we would discuss her lab results at her next visit unless there is a critical issue that needs to be addressed sooner. Kaitlin Robles agreed to keep her next visit at the agreed upon  time to discuss these results.  ALLERGIES: Allergies  Allergen Reactions  . Banana Nausea Only  . Ceclor [Cefaclor] Hives  . Venomil Wasp Venom [Wasp Venom] Anaphylaxis  . Nickel Dermatitis    MEDICATIONS: Current Outpatient Medications on File Prior to Visit  Medication Sig Dispense Refill  . atomoxetine (STRATTERA) 60 MG capsule     . buPROPion (WELLBUTRIN XL) 300 MG 24 hr tablet 300 mg.     . cetirizine (ZYRTEC) 10 MG tablet Take 10 mg by  mouth daily.    . diazepam (VALIUM) 5 MG tablet Take 2.5 mg by mouth every 6 (six) hours as needed for anxiety.    Marland Kitchen EPINEPHrine 0.3 mg/0.3 mL IJ SOAJ injection Inject 0.3 mg into the muscle once.    . escitalopram (LEXAPRO) 20 MG tablet Take 20 mg by mouth daily.    Marland Kitchen FISH OIL-KRILL OIL PO Take by mouth.    . Multiple Vitamins-Minerals (WOMENS MULTIVITAMIN PO) Take by mouth.    . triamcinolone ointment (KENALOG) 0.5 % Apply 1 application topically 2 (two) times daily. 30 g 0  . Vitamin D, Ergocalciferol, (DRISDOL) 1.25 MG (50000 UT) CAPS capsule Take 1 capsule (50,000 Units total) by mouth every 7 (seven) days. 12 capsule 3  . metFORMIN (GLUCOPHAGE) 1000 MG tablet Take 1 tablet (1,000 mg total) by mouth 2 (two) times daily with a meal. (Patient not taking: Reported on 06/26/2019) 180 tablet 3   No current facility-administered medications on file prior to visit.     PAST MEDICAL HISTORY: Past Medical History:  Diagnosis Date  . Abdominal pain   . ADHD   . Adrenal insufficiency (Addison's disease) (HCC)   . Anxiety disorder   . B12 deficiency   . Back pain   . Bilateral ovarian cysts   . Chest pain   . Chronic UTI   . Colon polyps   . Depression   . Double vision   . Enlarged lymph nodes   . Head ache   . Hyperlipidemia   . Jaw symptom   . Lichen sclerosus   . Lichen sclerosus of female genitalia   . Malabsorption   . Migraine headache   . Multiple food allergies   . Obesity   . Palpitations   . Panic attacks   . Urticaria   . Vitamin D deficiency     PAST SURGICAL HISTORY: Past Surgical History:  Procedure Laterality Date  . COLONOSCOPY    . KIDNEY STONE SURGERY  2013  . WISDOM TOOTH EXTRACTION  2010    SOCIAL HISTORY: Social History   Tobacco Use  . Smoking status: Never Smoker  . Smokeless tobacco: Never Used  Substance Use Topics  . Alcohol use: Never    Frequency: Never  . Drug use: Never    FAMILY HISTORY: Family History  Problem Relation Age of  Onset  . Diabetes Mother   . Hyperlipidemia Mother   . Hypertension Mother   . Miscarriages / [place] Mother   . Depression Mother   . Anxiety disorder Mother   . Obesity Mother   . Colon polyps Brother   . Hyperlipidemia Daughter   . COPD Maternal Grandfather   . Hypertension Maternal Grandfather   . Heart disease Maternal Grandfather   . Hypertension Paternal Grandfather   . Heart attack Paternal Grandfather   . Esophageal cancer Neg Hx   . Stomach cancer Neg Hx   . Colon cancer Neg Hx   . Pancreatic cancer Neg Hx  ROS: Review of Systems  Cardiovascular: Negative for orthopnea.  Gastrointestinal: Positive for abdominal pain. Negative for nausea and vomiting.  Musculoskeletal:       Negative for muscle weakness.  Psychiatric/Behavioral: Positive for depression (emotional eating). Negative for suicidal ideas.       Negative for homicidal ideas.   PHYSICAL EXAM: Blood pressure 127/84, pulse (!) 102, temperature 98 F (36.7 C), temperature source Oral, height 5\' 3"  (1.6 m), weight (!) 306 lb (138.8 kg), last menstrual period 06/15/2019, SpO2 97 %. Body mass index is 54.21 kg/m. Physical Exam Vitals signs reviewed.  Constitutional:      Appearance: Normal appearance. She is well-developed. She is obese.  HENT:     Head: Normocephalic and atraumatic.     Nose: Nose normal.  Eyes:     General: No scleral icterus. Neck:     Musculoskeletal: Normal range of motion.  Cardiovascular:     Rate and Rhythm: Normal rate and regular rhythm.  Pulmonary:     Effort: Pulmonary effort is normal. No respiratory distress.  Abdominal:     Palpations: Abdomen is soft.     Tenderness: There is no abdominal tenderness.  Musculoskeletal: Normal range of motion.     Comments: Range of motion normal in all four extremities.  Skin:    General: Skin is warm and dry.  Neurological:     Mental Status: She is alert and oriented to person, place, and time.     Coordination:  Coordination normal.  Psychiatric:        Mood and Affect: Mood and affect normal.        Behavior: Behavior normal.   RECENT LABS AND TESTS: BMET    Component Value Date/Time   NA 137 05/28/2019 1306   NA 139 10/18/2018   K 4.1 05/28/2019 1306   CL 103 05/28/2019 1306   CO2 27 05/28/2019 1306   GLUCOSE 103 (H) 05/28/2019 1306   BUN 9 05/28/2019 1306   BUN 5 10/18/2018   CREATININE 0.83 05/28/2019 1306   CALCIUM 9.3 05/28/2019 1306   GFRNONAA >60 05/28/2019 1306   GFRAA >60 05/28/2019 1306   No results found for: HGBA1C No results found for: INSULIN CBC    Component Value Date/Time   WBC 10.0 05/28/2019 1306   RBC 4.72 05/28/2019 1306   HGB 13.1 05/28/2019 1306   HCT 40.5 05/28/2019 1306   PLT 305 05/28/2019 1306   MCV 85.8 05/28/2019 1306   MCH 27.8 05/28/2019 1306   MCHC 32.3 05/28/2019 1306   RDW 13.8 05/28/2019 1306   Iron/TIBC/Ferritin/ %Sat    Component Value Date/Time   FERRITIN 26 04/14/2017   Lipid Panel     Component Value Date/Time   CHOL 212 (A) 10/19/2018   TRIG 264 (A) 10/19/2018   HDL 39 10/19/2018   CHOLHDL 4 11/10/2017 0917   VLDL 53.6 (H) 11/10/2017 0917   LDLCALC 120 10/19/2018   LDLDIRECT 131.0 11/10/2017 0917   Hepatic Function Panel     Component Value Date/Time   PROT 7.6 05/28/2019 1306   ALBUMIN 4.0 05/28/2019 1306   AST 20 05/28/2019 1306   ALT 19 05/28/2019 1306   ALKPHOS 77 05/28/2019 1306   BILITOT 0.8 05/28/2019 1306      Component Value Date/Time   TSH 2.51 11/10/2017 0917   Results for JONEE, LAMORE (MRN [id number]) as of 06/26/2019 12:22  Ref. 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 Abnormal precordial QRS contours - nondiagnostic for this age, possible septal Q-waves. Abnormal.  INDIRECT CALORIMETER done today shows a VO2 of 249 and a REE of 1736.  Her calculated basal metabolic rate is 1610 thus her basal metabolic rate is worse than  expected.  OBESITY BEHAVIORAL INTERVENTION VISIT  Today's visit was #1  Starting weight: 306 lbs Starting date: 06/26/2019 Today's weight: 306 lbs  Today's date: 06/26/2019 Total lbs lost to date: 0    06/26/2019  Height  (1.6 m)  Weight 306 lb (138.8 kg) (A)  BMI (Calculated) 54.22  BLOOD PRESSURE - SYSTOLIC 127  BLOOD PRESSURE - DIASTOLIC 84  Waist Measurement  58 inches   Body Fat % 54.5 %  Total Body Water (lbs) 99.8 lbs  RMR 1736   ASK: We discussed the diagnosis of obesity with Vergie Living today and Yanci agreed to give [place] permission to discuss obesity behavioral modification therapy today.  ASSESS: Tona has the diagnosis of obesity and her BMI today is 54.2. Laniah is in the action stage of change.   ADVISE: Janeal was educated on the multiple health risks of obesity as well as the benefit of weight loss to improve her health. She was advised of the need for long term treatment and the importance of lifestyle modifications to improve her current health and to decrease her risk of future health problems.  AGREE: Multiple dietary modification options and treatment options were discussed and  Nicolle agreed to follow the recommendations documented in the above note.  ARRANGE: Amparo was educated on the importance of frequent visits to treat obesity as outlined per CMS and USPSTF guidelines and agreed to schedule her next follow up appointment today.  IMarianna Payment, am acting as Energy manager for [employer]. Earlene Plater, DO  I have reviewed the above documentation for accuracy and completeness, and I agree with the above. Helane Rima, DO
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
## 2019-06-26 NOTE — Telephone Encounter (Signed)
See the imaging report.

## 2019-06-27 ENCOUNTER — Encounter (INDEPENDENT_AMBULATORY_CARE_PROVIDER_SITE_OTHER): Payer: Self-pay | Admitting: Family Medicine

## 2019-06-28 LAB — LIPID PANEL WITH LDL/HDL RATIO
Cholesterol, Total: 229 mg/dL — ABNORMAL HIGH (ref 100–199)
HDL: 40 mg/dL (ref >39)
LDL Chol Calc (NIH): 131 mg/dL — ABNORMAL HIGH (ref 0–99)
LDL/HDL Ratio: 3.3 ratio — ABNORMAL HIGH (ref 0.0–3.2)
Triglycerides: 324 mg/dL — ABNORMAL HIGH (ref 0–149)
VLDL Cholesterol Cal: 58 mg/dL — ABNORMAL HIGH (ref 5–40)

## 2019-06-28 LAB — ANEMIA PANEL
Ferritin: 36 ng/mL (ref 15–150)
Folate, Hemolysate: 422 ng/mL
Folate, RBC: 1066 ng/mL (ref >498)
Hematocrit: 39.6 % (ref 34.0–46.6)
Iron Saturation: 13 % — ABNORMAL LOW (ref 15–55)
Iron: 46 ug/dL (ref 27–159)
Retic Ct Pct: 1.7 % (ref 0.6–2.6)
Total Iron Binding Capacity: 358 ug/dL (ref 250–450)
UIBC: 312 ug/dL (ref 131–425)
Vitamin B-12: 439 pg/mL (ref 232–1245)

## 2019-06-28 LAB — CBC WITH DIFFERENTIAL/PLATELET
Basophils Absolute: 0.1 10*3/uL (ref 0.0–0.2)
Basos: 1 %
EOS (ABSOLUTE): 0.2 10*3/uL (ref 0.0–0.4)
Eos: 2 %
Hemoglobin: 13.1 g/dL (ref 11.1–15.9)
Immature Grans (Abs): 0 10*3/uL (ref 0.0–0.1)
Immature Granulocytes: 1 %
Lymphocytes Absolute: 2.2 10*3/uL (ref 0.7–3.1)
Lymphs: 29 %
MCH: 27.1 pg (ref 26.6–33.0)
MCHC: 33.1 g/dL (ref 31.5–35.7)
MCV: 82 fL (ref 79–97)
Monocytes Absolute: 0.4 10*3/uL (ref 0.1–0.9)
Monocytes: 6 %
Neutrophils Absolute: 4.6 10*3/uL (ref 1.4–7.0)
Neutrophils: 61 %
Platelets: 319 10*3/uL (ref 150–450)
RBC: 4.83 x10E6/uL (ref 3.77–5.28)
RDW: 14 % (ref 11.7–15.4)
WBC: 7.5 10*3/uL (ref 3.4–10.8)

## 2019-06-28 LAB — COMPREHENSIVE METABOLIC PANEL
ALT: 23 IU/L (ref 0–32)
AST: 21 IU/L (ref 0–40)
Albumin/Globulin Ratio: 1.3 (ref 1.2–2.2)
Albumin: 4 g/dL (ref 3.9–5.0)
Alkaline Phosphatase: 98 IU/L (ref 39–117)
BUN/Creatinine Ratio: 12 (ref 9–23)
BUN: 9 mg/dL (ref 6–20)
Bilirubin Total: 0.4 mg/dL (ref 0.0–1.2)
CO2: 23 mmol/L (ref 20–29)
Calcium: 9.1 mg/dL (ref 8.7–10.2)
Chloride: 101 mmol/L (ref 96–106)
Creatinine, Ser: 0.74 mg/dL (ref 0.57–1.00)
GFR calc Af Amer: 128 mL/min/{1.73_m2} (ref >59)
GFR calc non Af Amer: 111 mL/min/{1.73_m2} (ref >59)
Globulin, Total: 3.1 g/dL (ref 1.5–4.5)
Glucose: 99 mg/dL (ref 65–99)
Potassium: 4.2 mmol/L (ref 3.5–5.2)
Sodium: 137 mmol/L (ref 134–144)
Total Protein: 7.1 g/dL (ref 6.0–8.5)

## 2019-06-28 LAB — INSULIN, RANDOM: INSULIN: 27.3 u[IU]/mL — ABNORMAL HIGH (ref 2.6–24.9)

## 2019-06-28 LAB — HEMOGLOBIN A1C
Est. average glucose Bld gHb Est-mCnc: 100 mg/dL
Hgb A1c MFr Bld: 5.1 % (ref 4.8–5.6)

## 2019-06-28 LAB — T4, FREE: Free T4: 1.01 ng/dL (ref 0.82–1.77)

## 2019-06-28 LAB — VITAMIN D 25 HYDROXY (VIT D DEFICIENCY, FRACTURES): Vit D, 25-Hydroxy: 21.1 ng/mL — ABNORMAL LOW (ref 30.0–100.0)

## 2019-06-28 LAB — TSH: TSH: 2.53 u[IU]/mL (ref 0.450–4.500)

## 2019-06-28 LAB — T3: T3, Total: 114 ng/dL (ref 71–180)

## 2019-07-05 ENCOUNTER — Other Ambulatory Visit: Payer: Self-pay

## 2019-07-05 ENCOUNTER — Other Ambulatory Visit: Payer: Self-pay | Admitting: Cardiology

## 2019-07-05 ENCOUNTER — Ambulatory Visit (INDEPENDENT_AMBULATORY_CARE_PROVIDER_SITE_OTHER): Payer: Commercial Managed Care - PPO | Admitting: Psychology

## 2019-07-05 DIAGNOSIS — F3289 Other specified depressive episodes: Secondary | ICD-10-CM | POA: Diagnosis not present

## 2019-07-05 DIAGNOSIS — F909 Attention-deficit hyperactivity disorder, unspecified type: Secondary | ICD-10-CM

## 2019-07-05 DIAGNOSIS — Z20822 Contact with and (suspected) exposure to covid-19: Secondary | ICD-10-CM

## 2019-07-09 ENCOUNTER — Ambulatory Visit (INDEPENDENT_AMBULATORY_CARE_PROVIDER_SITE_OTHER): Payer: Commercial Managed Care - PPO | Admitting: Neurology

## 2019-07-09 ENCOUNTER — Other Ambulatory Visit: Payer: Self-pay

## 2019-07-09 ENCOUNTER — Encounter: Payer: Self-pay | Admitting: Neurology

## 2019-07-09 VITALS — BP 111/67 | HR 109 | Temp 97.3°F | Ht 63.0 in | Wt 307.0 lb

## 2019-07-09 DIAGNOSIS — R351 Nocturia: Secondary | ICD-10-CM

## 2019-07-09 DIAGNOSIS — G4719 Other hypersomnia: Secondary | ICD-10-CM | POA: Diagnosis not present

## 2019-07-09 DIAGNOSIS — R519 Headache, unspecified: Secondary | ICD-10-CM

## 2019-07-09 DIAGNOSIS — Z6841 Body Mass Index (BMI) 40.0 and over, adult: Secondary | ICD-10-CM

## 2019-07-09 DIAGNOSIS — R0683 Snoring: Secondary | ICD-10-CM | POA: Diagnosis not present

## 2019-07-09 DIAGNOSIS — G4763 Sleep related bruxism: Secondary | ICD-10-CM

## 2019-07-09 LAB — NOVEL CORONAVIRUS, NAA: SARS-CoV-2, NAA: NOT DETECTED

## 2019-07-09 NOTE — Progress Notes (Signed)
Subjective:    Patient ID: Kaitlin Robles is a 28 y.o. female.  HPI     Kaitlin FoleySaima Benett Swoyer, MD, PhD [facility] [address], [address] [address] [place], [place] [address]  Dear Dr. Earlene PlaterWallace,  I saw your patient, Kaitlin Robles, upon your kind request in my sleep clinic today for initial consultation of her sleep disorder, in particular, concern for underlying obstructive sleep apnea.  The patient is unaccompanied today.  As you know, Kaitlin Robles is a 28 year old right-handed woman with an underlying medical history of Addison's disease, B12 deficiency, vitamin D deficiency, migraine headaches, hyperlipidemia, depression, anxiety, ADHD, PCOS, palpitations, and morbid obesity with a BMI of over 50, who reports snoring and excessive daytime somnolence. Her Epworth sleepiness score is 11 out of 24 today, fatigue severity score is 43 out of 63. I reviewed your office note from 06/26/2019.  She has difficulty maintaining sleep.  Some years ago she was on Ambien when she was in college.  She had more problems with depression and anxiety at the time.  She is followed by psychiatry and sees a therapist on a regular basis and from the mood standpoint is doing much better.  Her bedtime is around 11, rise time around 630.  She does wake up once in the early morning hours to go to the bathroom and has woken up with the occasional headache.  She is not aware of any formal diagnosis of sleep apnea in her family but her mother and brother both snore loudly.  She is a restless sleeper.  She is single, lives alone, 1 cat in the household, does not have a TV in the bedroom.  She works as a Veterinary surgeonland use planner.  She has a longer standing history of bruxism and had a bite guard even in high school, she is on her second bite guard.  Very occasionally would she take some Valium which helps her tooth grinding and also helps her sleep but she knows to take it sparingly and estimates that she takes it about twice a  month.   Her Past Medical History Is Significant For: Past Medical History:  Diagnosis Date  . Abdominal pain   . ADHD   . Adrenal insufficiency (Addison's disease) (HCC)   . Anxiety disorder   . B12 deficiency   . Back pain   . Bilateral ovarian cysts   . Chest pain   . Chronic UTI   . Colon polyps   . Depression   . Double vision   . Enlarged lymph nodes   . Head ache   . Hyperlipidemia   . Jaw symptom   . Lichen sclerosus   . Lichen sclerosus of female genitalia   . Malabsorption   . Migraine headache   . Multiple food allergies   . Obesity   . Palpitations   . Panic attacks   . Urticaria   . Vitamin D deficiency     Her Past Surgical History Is Significant For: Past Surgical History:  Procedure Laterality Date  . COLONOSCOPY    . KIDNEY STONE SURGERY  2013  . WISDOM TOOTH EXTRACTION  2010    Her Family History Is Significant For: Family History  Problem Relation Age of Onset  . Diabetes Mother   . Hyperlipidemia Mother   . Hypertension Mother   . Miscarriages / [place] Mother   . Depression Mother   . Anxiety disorder Mother   . Obesity Mother   . Colon polyps Brother   .  Hyperlipidemia Daughter   . COPD Maternal Grandfather   . Hypertension Maternal Grandfather   . Heart disease Maternal Grandfather   . Hypertension Paternal Grandfather   . Heart attack Paternal Grandfather   . Esophageal cancer Neg Hx   . Stomach cancer Neg Hx   . Colon cancer Neg Hx   . Pancreatic cancer Neg Hx     Her Social History Is Significant For: Social History   Socioeconomic History  . Marital status: Single    Spouse name: Not on file  . Number of children: Not on file  . Years of education: Not on file  . Highest education level: Not on file  Occupational History  . Not on file  Social Needs  . Financial resource strain: Not on file  . Food insecurity    Worry: Not on file    Inability: Not on file  . Transportation needs    Medical: Not on file     Non-medical: Not on file  Tobacco Use  . Smoking status: Never Smoker  . Smokeless tobacco: Never Used  Substance and Sexual Activity  . Alcohol use: Never    Frequency: Never  . Drug use: Never  . Sexual activity: Not Currently  Lifestyle  . Physical activity    Days per week: Not on file    Minutes per session: Not on file  . Stress: Not on file  Relationships  . Social Musician on phone: Not on file    Gets together: Not on file    Attends religious service: Not on file    Active member of club or organization: Not on file    Attends meetings of clubs or organizations: Not on file    Relationship status: Not on file  Other Topics Concern  . Not on file  Social History Narrative  . Not on file    Her Allergies Are:  Allergies  Allergen Reactions  . Banana Nausea Only  . Ceclor [Cefaclor] Hives  . Venomil Wasp Venom [Wasp Venom] Anaphylaxis  . Nickel Dermatitis  :   Her Current Medications Are:  Outpatient Encounter Medications as of 07/09/2019  Medication Sig  . atomoxetine (STRATTERA) 60 MG capsule   . buPROPion (WELLBUTRIN XL) 300 MG 24 hr tablet 300 mg.   . cetirizine (ZYRTEC) 10 MG tablet Take 10 mg by mouth daily.  . diazepam (VALIUM) 5 MG tablet Take 2.5 mg by mouth every 6 (six) hours as needed for anxiety.  Marland Kitchen EPINEPHrine 0.3 mg/0.3 mL IJ SOAJ injection Inject 0.3 mg into the muscle once.  . escitalopram (LEXAPRO) 20 MG tablet Take 20 mg by mouth daily.  Marland Kitchen FISH OIL-KRILL OIL PO Take by mouth.  . metFORMIN (GLUCOPHAGE) 1000 MG tablet Take 1 tablet (1,000 mg total) by mouth 2 (two) times daily with a meal.  . Multiple Vitamins-Minerals (WOMENS MULTIVITAMIN PO) Take by mouth.  . triamcinolone ointment (KENALOG) 0.5 % Apply 1 application topically 2 (two) times daily.  . Vitamin D, Ergocalciferol, (DRISDOL) 1.25 MG (50000 UT) CAPS capsule Take 1 capsule (50,000 Units total) by mouth every 7 (seven) days.   No facility-administered encounter  medications on file as of 07/09/2019.   :  Review of Systems:  Out of a complete 14 point review of systems, all are reviewed and negative with the exception of these symptoms as listed below: Review of Systems  Neurological:       Pt presents today to discuss her sleep. Pt  has never had a sleep study but does endorse snoring.  Epworth Sleepiness Scale 0= would never doze 1= slight chance of dozing 2= moderate chance of dozing 3= high chance of dozing  Sitting and reading: 1 Watching TV: 2 Sitting inactive in a public place (ex. Theater or meeting): 0 As a passenger in a car for an hour without a break: 3 Lying down to rest in the afternoon: 3 Sitting and talking to someone: 0 Sitting quietly after lunch (no alcohol): 2 In a car, while stopped in traffic: 0 Total: 11     Objective:  Neurological Exam  Physical Exam   Physical Examination:   Vitals:   07/09/19 1315  BP: 111/67  Pulse: (!) 109  Temp: (!) 97.3 F (36.3 C)    General Examination: The patient is a very pleasant 28 y.o. female in no acute distress. She appears well-developed and well-nourished and well groomed.   HEENT: Normocephalic, atraumatic, pupils are equal, round and reactive to light, extraocular tracking is good without limitation to gaze excursion or nystagmus noted. Corrective eyeglasses in place. Hearing is grossly intact. Face is symmetric with normal facial animation. Speech is clear with no dysarthria noted. There is no hypophonia. There is no lip, neck/head, jaw or voice tremor. Neck is supple with full range of passive and active motion. There are no carotid bruits on auscultation. Oropharynx exam reveals: mild mouth dryness, good dental hygiene and moderate airway crowding, due to Small airway entry, Mallampati class III, tonsils smaller, uvula also in the smaller side, tongue protrudes centrally in palate elevates symmetrically, neck circumference is 18-1/2 inches.  She has a mild  overbite  Chest: Clear to auscultation without wheezing, rhonchi or crackles noted.  Heart: S1+S2+0, regular and normal without murmurs, rubs or gallops noted.   Abdomen: Soft, non-tender and non-distended with normal bowel sounds appreciated on auscultation.  Extremities: There is no pitting edema in the distal lower extremities bilaterally.   Skin: Warm and dry without trophic changes noted.   Musculoskeletal: exam reveals no obvious joint deformities, tenderness or joint swelling or erythema.   Neurologically:  Mental status: The patient is awake, alert and oriented in all 4 spheres. Her immediate and remote memory, attention, language skills and fund of knowledge are appropriate. There is no evidence of aphasia, agnosia, apraxia or anomia. Speech is clear with normal prosody and enunciation. Thought process is linear. Mood is normal and affect is normal.  Cranial nerves II - XII are as described above under HEENT exam.  Motor exam: Normal bulk, strength and tone is noted. There is no tremor, Romberg is negative. Reflexes are 2+ throughout. Fine motor skills and coordination: grossly intact.  Cerebellar testing: No dysmetria or intention tremor. There is no truncal or gait ataxia.  Sensory exam: intact to light touch in the upper and lower extremities.  Gait, station and balance: She stands easily. No veering to one side is noted. No leaning to one side is noted. Posture is age-appropriate and stance is narrow based. Gait shows normal stride length and normal pace. No problems turning are noted. Tandem walk is unremarkable.                            Assessment and Plan:  In summary, Kaitlin Robles is a very pleasant 28 y.o.-year old female with an underlying medical history of Addison's disease, B12 deficiency, vitamin D deficiency, migraine headaches, hyperlipidemia, depression, anxiety, ADHD, PCOS, palpitations, and  morbid obesity with a BMI of over 50, whose history and physical exam are  concerning for obstructive sleep apnea (OSA). I had a long chat with the patient about my findings and the diagnosis of OSA, its prognosis and treatment options. We talked about medical treatments, surgical interventions and non-pharmacological approaches. I explained in particular the risks and ramifications of untreated moderate to severe OSA, especially with respect to developing cardiovascular disease down the Road, including congestive heart failure, difficult to treat hypertension, cardiac arrhythmias, or stroke. Even type 2 diabetes has, in part, been linked to untreated OSA. Symptoms of untreated OSA include daytime sleepiness, memory problems, mood irritability and mood disorder such as depression and anxiety, lack of energy, as well as recurrent headaches, especially morning headaches. We talked about trying to maintain a healthy lifestyle in general, as well as the importance of weight control. We also talked about the importance of good sleep hygiene. I recommended the following at this time: sleep study.   I explained the sleep test procedure to the patient and also outlined possible surgical and non-surgical treatment options of OSA, including the use of a custom-made dental device (which would require a referral to a specialist dentist or oral surgeon), upper airway surgical options, such as traditional UPPP or a novel less invasive surgical option in the form of Inspire hypoglossal nerve stimulation (which would involve a referral to an ENT surgeon). I also explained the CPAP treatment option to the patient, who indicated that she would be willing to try CPAP if the need arises. I explained the importance of being compliant with PAP treatment, not only for insurance purposes but primarily to improve Her symptoms, and for the patient's long term health benefit, including to reduce Her cardiovascular risks. I answered all her questions today and the patient was in agreement. I plan to see her back  after the sleep study is completed and encouraged her to call with any interim questions, concerns, problems or updates.   Thank you very much for allowing me to participate in the care of this nice patient. If I can be of any further assistance to you please do not hesitate to call me at [phone].  Sincerely,   Star Age, MD, PhD

## 2019-07-09 NOTE — Patient Instructions (Signed)

## 2019-07-10 ENCOUNTER — Other Ambulatory Visit: Payer: Self-pay

## 2019-07-10 ENCOUNTER — Encounter (INDEPENDENT_AMBULATORY_CARE_PROVIDER_SITE_OTHER): Payer: Self-pay | Admitting: Family Medicine

## 2019-07-10 ENCOUNTER — Ambulatory Visit (INDEPENDENT_AMBULATORY_CARE_PROVIDER_SITE_OTHER): Payer: Commercial Managed Care - PPO | Admitting: Family Medicine

## 2019-07-10 VITALS — BP 126/89 | HR 98 | Temp 97.9°F | Ht 63.0 in | Wt 301.0 lb

## 2019-07-10 DIAGNOSIS — F3289 Other specified depressive episodes: Secondary | ICD-10-CM

## 2019-07-10 DIAGNOSIS — G4730 Sleep apnea, unspecified: Secondary | ICD-10-CM | POA: Diagnosis not present

## 2019-07-10 DIAGNOSIS — E7849 Other hyperlipidemia: Secondary | ICD-10-CM | POA: Diagnosis not present

## 2019-07-10 DIAGNOSIS — E8881 Metabolic syndrome: Secondary | ICD-10-CM | POA: Diagnosis not present

## 2019-07-10 DIAGNOSIS — Z6841 Body Mass Index (BMI) 40.0 and over, adult: Secondary | ICD-10-CM

## 2019-07-10 DIAGNOSIS — Z9189 Other specified personal risk factors, not elsewhere classified: Secondary | ICD-10-CM

## 2019-07-10 DIAGNOSIS — E559 Vitamin D deficiency, unspecified: Secondary | ICD-10-CM

## 2019-07-10 MED ORDER — VITAMIN D (ERGOCALCIFEROL) 1.25 MG (50000 UNIT) PO CAPS: ORAL_CAPSULE | ORAL | 0 refills | Status: DC

## 2019-07-10 NOTE — Progress Notes (Signed)
Office: [phone]  /  Fax: [phone]    Date: July 23, 2019    Appointment Start Time: 2:34pm Duration: 27 minutes Provider: Lawerance Cruel, Psy.D. 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 She could not locate the e-mail; therefore, the e-mail with the secure link was re-sent. As such, today's appointment was initiated 4 minutes late. Kaitlin Robles is a 28 y.o. female presenting via [employer] for a follow-up appointment to address the previously established treatment goal of decreasing emotional eating. Today's appointment was a telepsychological visit due to COVID-19. [name] provided verbal consent for today's telepsychological appointment and she is aware she is responsible for securing confidentiality on her end of the session. Prior to proceeding with today's appointment, [name]'s physical location at the time of this appointment was obtained as well a phone number she could be reached at in the event of technical difficulties. [name] and this provider participated in today's telepsychological service.   This provider conducted a brief check-in and verbally administered the PHQ-9 and GAD-7. Kaitlin Robles stated, "My life is chaos in general." She reported she is in the process of applying for a new job. As such, she discussed "eating has taken the back burner." Kaitlin Robles further reported she "ran out" of Wellbutrin resulting in changes in her mood. She indicated she is back to taking it as prescribed. Moreover, psychoeducation regarding triggers for emotional eating was provided. Kaitlin Robles was provided a handout, and encouraged to utilize the handout between now and the next appointment to increase awareness of triggers and frequency. Kaitlin Robles agreed. This provider also discussed  behavioral strategies for specific triggers, such as placing the utensil down when conversing to avoid mindless eating. Kaitlin Robles provided verbal consent during today's appointment for this provider to send a handout about triggers for emotional eating via e-mail. Kaitlin Robles was receptive to today's appointment as evidenced by openness to sharing, responsiveness to feedback, and willingness to explore triggers for emotional eating.  Mental Status Examination:  Appearance: appropriate grooming and hygiene and casually dressed Behavior: appropriate to circumstances Mood: euthymic Affect: mood congruent Speech: normal in rate, volume, and tone Eye Contact: appropriate Psychomotor Activity: appropriate Gait: unable to assess Thought Process: linear, logical, and goal directed  Thought Content/Perception: denies suicidal and homicidal ideation, plan, and intent and no hallucinations, delusions, bizarre thinking or behavior reported or observed Orientation: time, person, place and purpose of appointment Memory/Concentration: memory, attention, language, and fund of knowledge intact  Insight/Judgment: good  Structured Assessments Results: The Patient Health Questionnaire-9 (PHQ-9) is a self-report measure that assesses symptoms and severity of depression over the course of the last two weeks. Kaitlin Robles obtained a score of 8 suggesting mild depression. Kaitlin Robles finds the endorsed symptoms to be very difficult. [0= Not at all; 1= Several days; 2= More than half the days; 3= Nearly every day] Little interest or pleasure in doing things 1  Feeling down, depressed, or hopeless 1  Trouble falling or staying asleep, or sleeping too much 1  Feeling tired or having little energy 2  Poor appetite or overeating 0  Feeling bad about yourself --- or that you are a failure or have let yourself or your family down 1  Trouble concentrating on things, such as reading the newspaper or watching television 1  Moving or speaking so  slowly  that other people could have noticed? Or the opposite --- being so fidgety or restless that you have been moving around a lot more than usual 1  Thoughts that you would be better off dead or hurting yourself in some way 0  PHQ-9 Score 8    The Generalized Anxiety Disorder-7 (GAD-7) is a brief self-report measure that assesses symptoms of anxiety over the course of the last two weeks. Kaitlin Robles obtained a score of 6 suggesting mild anxiety. Kaitlin Robles finds the endorsed symptoms to be somewhat difficult. [0= Not at all; 1= Several days; 2= Over half the days; 3= Nearly every day] Feeling nervous, anxious, on edge 2  Not being able to stop or control worrying 1  Worrying too much about different things 1  Trouble relaxing 2  Being so restless that it's hard to sit still 0  Becoming easily annoyed or irritable 0  Feeling afraid as if something awful might happen 0  GAD-7 Score 6   Interventions:  Conducted a brief chart review Verbally administered PHQ-9 and GAD-7 for symptom monitoring Conducted a risk assessment Provided empathic reflections and validation Reviewed content from the previous session Employed supportive psychotherapy interventions to facilitate reduced distress and to improve coping skills with identified stressors Employed motivational interviewing skills to assess patient's willingness/desire to adhere to recommended medical treatments and assignments Psychoeducation provided regarding triggers for emotional eating  DSM-5 Diagnoses: 311 (F32.8) Other Specified Depressive Disorder, Emotional Eating Behaviors and 314.01 (F90.9) Unspecified Attention-Deficit/Hyperactivity Disorder   Treatment Goal & Progress: During the initial appointment with this provider, the following treatment goal was established: decrease emotional eating. Progress is limited, as Kaitlin Robles has just begun treatment with this provider; however, she is receptive to the interaction and interventions and rapport  is being established.   Plan: Due to the upcoming holidays and this provider being out of the office in the coming weeks, the next appointment will be scheduled in one month, which will be via [employer]. Loyal acknowledged understanding and noted a plan to continue meeting with her other therapist, Ms. Elpidio Galea. Lashayla's next appointment with Ms. Elpidio Galea is on July 25, 2019. The next session with this provider will focus on working towards the established treatment goal.

## 2019-07-11 ENCOUNTER — Ambulatory Visit: Payer: Commercial Managed Care - PPO | Admitting: Gastroenterology

## 2019-07-11 NOTE — Progress Notes (Signed)
Office: [phone]  /  Fax: [phone]   HPI:   Chief Complaint: OBESITY Kaitlin Robles is here to discuss her progress with her obesity treatment plan. She is on the Category 2 plan and is following her eating plan approximately 90 % of the time. She states she is doing light yoga for 30-40 minutes 2 times per week. Kaitlin Robles is struggling with "disordered eating" not planning for meals in the morning. She notes increased hunger in the evening.  Her weight is (!) 301 lb (136.5 kg) today and has had a weight loss of 5 pounds over a period of 2 weeks since her last visit. She has lost 5 lbs since starting treatment with us.  Insulin Resistance Kaitlin Robles has a diagnosis of insulin resistance based on her elevated fasting insulin level >5. Although Kaitlin Robles's blood glucose readings are still under good control, insulin resistance puts her at greater risk of metabolic syndrome and diabetes. She has a history of PCOS, and she is followed by Dr. Elvera LennoxGherghe. She restarted metformin at 1,000 mg PO BID and had increased diarrhea. She continues to work on diet and exercise to decrease risk of diabetes.  At risk for diabetes Kaitlin Robles is at higher than average risk for developing diabetes due to her obesity and insulin resistance. She currently denies polyuria or polydipsia.  Hyperlipidemia Kaitlin Robles has hyperlipidemia and has been trying to improve her cholesterol levels with intensive lifestyle modification including a low saturated fat diet, exercise and weight loss. Labs were reviewed with the patient. She denies any chest pain, claudication or myalgias.  Vitamin D Deficiency Kaitlin Robles has a diagnosis of vitamin D deficiency. She is currently taking prescription Vit D 50,000 IU weekly. She denies nausea, vomiting or muscle weakness.  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 She has a visit with Neurology.  Depression with Emotional Eating Behaviors Kaitlin Robles had a visit with Dr. Dewaine CongerBarker. She  is taking Wellbutrin XL 300 mg daily. Kaitlin Robles struggles with emotional eating and using food for comfort to the extent that it is negatively impacting her health. She often snacks when she is not hungry. Kaitlin Robles sometimes feels she is out of control and then feels guilty that she made poor food choices. She has been working on behavior modification techniques to help reduce her emotional eating and has been somewhat successful. She shows no sign of suicidal or homicidal ideations.  [facility] 06/26/2019 11/10/2017  Decreased Interest 2 0  Down, Depressed, Hopeless 2 0  PHQ - 2 Score 4 0  Altered sleeping 1 -  Tired, decreased energy 1 -  Change in appetite 2 -  Feeling bad or failure about yourself  3 -  Trouble concentrating 1 -  Moving slowly or fidgety/restless 0 -  Suicidal thoughts 1 -  PHQ-9 Score 13 -  Difficult doing work/chores Very difficult -   ASSESSMENT AND PLAN:  Insulin resistance  Other hyperlipidemia  Vitamin D deficiency - Plan: Vitamin D, Ergocalciferol, (DRISDOL) 1.25 MG (50000 UT) CAPS capsule  Sleep disorder breathing  Other depression,with emotional eating   At risk for diabetes mellitus  Class 3 severe obesity with serious comorbidity and body mass index (BMI) of 50.0 to 59.9 in adult, unspecified obesity type (HCC)  PLAN:  Insulin Resistance Kaitlin Robles will continue to work on weight loss, exercise, and decreasing simple carbohydrates in her diet to help decrease the risk of diabetes. We dicussed metformin including benefits and risks. She was informed that eating too many simple  carbohydrates or too many calories at one sitting increases the likelihood of GI side effects. Kaitlin Robles will lower the dose of metformin and increase more slowly. Kaitlin Robles agrees to follow up with our clinic in 2 weeks as directed to monitor her progress.  Diabetes risk counseling Kaitlin Robles was given extended (15 minutes) diabetes prevention counseling today. She is 28 y.o. female  and has risk factors for diabetes including obesity and insulin resistance. We discussed intensive lifestyle modifications today with an emphasis on weight loss as well as increasing exercise and decreasing simple carbohydrates in her diet.  Hyperlipidemia Kaitlin Robles was informed of the American Heart Association Guidelines emphasizing intensive lifestyle modifications as the first line treatment for hyperlipidemia. We discussed many lifestyle modifications today in depth, and Kaitlin Robles will continue to work on decreasing saturated fats such as fatty red meat, butter and many fried foods. She will also increase vegetables and lean protein in her diet and continue to work on exercise and weight loss efforts. We will continue to monitor.  Vitamin D Deficiency Kaitlin Robles was informed that low vitamin D levels contributes to fatigue and are associated with obesity, breast, and colon cancer. Kaitlin Robles agrees to increase prescription to Vit D 50,000 IU every 4 days #8 with no refills. She will follow up for routine testing of vitamin D, at least 2-3 times per year. She was informed of the risk of over-replacement of vitamin D and agrees to not increase her dose unless she discusses this with [place] first. Kaitlin Robles agrees to follow up with our clinic in 2 weeks.  Sleep Disorder Breathing Kaitlin Robles has an upcoming sleep study, and will follow up.  Depression with Emotional Eating Behaviors We discussed behavior modification techniques today to help Kaitlin Robles deal with her emotional eating and depression. Kaitlin Robles agrees to continue taking Wellbutrin XL 300 mg PO daily. Kaitlin Robles agrees to follow up with our clinic in 2 weeks.  Obesity Kaitlin Robles is currently in the action stage of change. As such, her goal is to continue with weight loss efforts She has agreed to follow the Category 2 plan Kaitlin Robles has been instructed to work up to a goal of 150 minutes of combined cardio and strengthening exercise per week or as tolerated for weight loss and overall  health benefits. We discussed the following Behavioral Modification Strategies today: increasing lean protein intake, decreasing simple carbohydrates, increasing vegetables, increase H20 intake, work on meal planning and easy cooking plans, holiday eating strategies  and decrease liquid calories We reviewed planning strategies.  Kaitlin Robles has agreed to follow up with our clinic in 2 weeks. She was informed of the importance of frequent follow up visits to maximize her success with intensive lifestyle modifications for her multiple health conditions.  ALLERGIES: Allergies  Allergen Reactions  . Banana Nausea Only  . Ceclor [Cefaclor] Hives  . Venomil Wasp Venom [Wasp Venom] Anaphylaxis  . Nickel Dermatitis   MEDICATIONS: Current Outpatient Medications on File Prior to Visit  Medication Sig Dispense Refill  . atomoxetine (STRATTERA) 60 MG capsule     . buPROPion (WELLBUTRIN XL) 300 MG 24 hr tablet 300 mg.     . cetirizine (ZYRTEC) 10 MG tablet Take 10 mg by mouth daily.    . diazepam (VALIUM) 5 MG tablet Take 2.5 mg by mouth every 6 (six) hours as needed for anxiety.    Kaitlin Robles Kitchen EPINEPHrine 0.3 mg/0.3 mL IJ SOAJ injection Inject 0.3 mg into the muscle once.    . escitalopram (LEXAPRO) 20 MG tablet Take 20 mg by  mouth daily.    Kaitlin Robles Kitchen FISH OIL-KRILL OIL PO Take by mouth.    . metFORMIN (GLUCOPHAGE) 1000 MG tablet Take 1 tablet (1,000 mg total) by mouth 2 (two) times daily with a meal. 180 tablet 3  . Multiple Vitamins-Minerals (WOMENS MULTIVITAMIN PO) Take by mouth.    . triamcinolone ointment (KENALOG) 0.5 % Apply 1 application topically 2 (two) times daily. 30 g 0   No current facility-administered medications on file prior to visit.    PAST MEDICAL HISTORY: Past Medical History:  Diagnosis Date  . Abdominal pain   . ADHD   . Adrenal insufficiency (Addison's disease) ([place])   . Anxiety disorder   . B12 deficiency   . Back pain   . Bilateral ovarian cysts   . Chest pain   . Chronic UTI   .  Colon polyps   . Depression   . Double vision   . Enlarged lymph nodes   . Head ache   . Hyperlipidemia   . Jaw symptom   . Lichen sclerosus   . Lichen sclerosus of female genitalia   . Malabsorption   . Migraine headache   . Multiple food allergies   . Obesity   . Palpitations   . Panic attacks   . Urticaria   . Vitamin D deficiency    PAST SURGICAL HISTORY: Past Surgical History:  Procedure Laterality Date  . COLONOSCOPY    . KIDNEY STONE SURGERY  2013  . WISDOM TOOTH EXTRACTION  2010   SOCIAL HISTORY: Social History   Tobacco Use  . Smoking status: Never Smoker  . Smokeless tobacco: Never Used  Substance Use Topics  . Alcohol use: Never    Frequency: Never  . Drug use: Never   FAMILY HISTORY: Family History  Problem Relation Age of Onset  . Diabetes Mother   . Hyperlipidemia Mother   . Hypertension Mother   . Miscarriages / [place] Mother   . Depression Mother   . Anxiety disorder Mother   . Obesity Mother   . Colon polyps Brother   . Hyperlipidemia Daughter   . COPD Maternal Grandfather   . Hypertension Maternal Grandfather   . Heart disease Maternal Grandfather   . Hypertension Paternal Grandfather   . Heart attack Paternal Grandfather   . Esophageal cancer Neg Hx   . Stomach cancer Neg Hx   . Colon cancer Neg Hx   . Pancreatic cancer Neg Hx    ROS: Review of Systems  Constitutional: Positive for weight loss.  Cardiovascular: Negative for chest pain and claudication.  Gastrointestinal: Positive for diarrhea. Negative for nausea and vomiting.  Genitourinary: Negative for frequency.  Musculoskeletal: Negative for myalgias.       Negative muscle weakness  Endo/Heme/Allergies: Negative for polydipsia.  Psychiatric/Behavioral: Positive for depression. Negative for suicidal ideas.    PHYSICAL EXAM: Blood pressure 126/89, pulse 98, temperature 97.9 F (36.6 C), temperature source Oral, height 5\' 3"  (1.6 m), weight (!) 301 lb (136.5 kg), last  menstrual period 06/15/2019, SpO2 96 %. Body mass index is 53.32 kg/m. Physical Exam Vitals signs reviewed.  Constitutional:      Appearance: Normal appearance. She is obese.  Cardiovascular:     Rate and Rhythm: Normal rate.     Pulses: Normal pulses.  Pulmonary:     Effort: Pulmonary effort is normal.     Breath sounds: Normal breath sounds.  Musculoskeletal: Normal range of motion.  Skin:    General: Skin is warm and dry.  Neurological:     Mental Status: She is alert and oriented to person, place, and time.  Psychiatric:        Mood and Affect: Mood normal.        Behavior: Behavior normal.     RECENT LABS AND TESTS: BMET    Component Value Date/Time   NA 137 06/26/2019 0849   K 4.2 06/26/2019 0849   CL 101 06/26/2019 0849   CO2 23 06/26/2019 0849   GLUCOSE 99 06/26/2019 0849   GLUCOSE 103 (H) 05/28/2019 1306   BUN 9 06/26/2019 0849   CREATININE 0.74 06/26/2019 0849   CALCIUM 9.1 06/26/2019 0849   GFRNONAA 111 06/26/2019 0849   GFRAA 128 06/26/2019 0849   Lab Results  Component Value Date   HGBA1C 5.1 06/26/2019   Lab Results  Component Value Date   INSULIN 27.3 (H) 06/26/2019   CBC    Component Value Date/Time   WBC 7.5 06/26/2019 0849   WBC 10.0 05/28/2019 1306   RBC 4.83 06/26/2019 0849   RBC 4.72 05/28/2019 1306   HGB 13.1 06/26/2019 0849   HCT 39.6 06/26/2019 0849   PLT 319 06/26/2019 0849   MCV 82 06/26/2019 0849   MCH 27.1 06/26/2019 0849   MCH 27.8 05/28/2019 1306   MCHC 33.1 06/26/2019 0849   MCHC 32.3 05/28/2019 1306   RDW 14.0 06/26/2019 0849   LYMPHSABS 2.2 06/26/2019 0849   EOSABS 0.2 06/26/2019 0849   BASOSABS 0.1 06/26/2019 0849   Iron/TIBC/Ferritin/ %Sat    Component Value Date/Time   IRON 46 06/26/2019 0849   TIBC 358 06/26/2019 0849   FERRITIN 36 06/26/2019 0849   FERRITIN 26 04/14/2017   IRONPCTSAT 13 (L) 06/26/2019 0849   Lipid Panel     Component Value Date/Time   CHOL 229 (H) 06/26/2019 0849   TRIG 324 (H)  06/26/2019 0849   HDL 40 06/26/2019 0849   CHOLHDL 4 11/10/2017 0917   VLDL 53.6 (H) 11/10/2017 0917   LDLCALC 131 (H) 06/26/2019 0849   LDLDIRECT 131.0 11/10/2017 0917   Hepatic Function Panel     Component Value Date/Time   PROT 7.1 06/26/2019 0849   ALBUMIN 4.0 06/26/2019 0849   AST 21 06/26/2019 0849   ALT 23 06/26/2019 0849   ALKPHOS 98 06/26/2019 0849   BILITOT 0.4 06/26/2019 0849      Component Value Date/Time   TSH 2.530 06/26/2019 0849   TSH 2.51 11/10/2017 0917      OBESITY BEHAVIORAL INTERVENTION VISIT  Today's visit was # 2   Starting weight: 306 lbs Starting date: 06/26/2019 Today's weight : 301 lbs Today's date: 07/10/2019 Total lbs lost to date: 5  ASK: We discussed the diagnosis of obesity with Kaitlin Robles today and Kaitlin Robles agreed to give [place] permission to discuss obesity behavioral modification therapy today.  ASSESS: Kaitlin Robles has the diagnosis of obesity and her BMI today is 53.33 Kaitlin Robles is in the action stage of change   ADVISE: Kaitlin Robles was educated on the multiple health risks of obesity as well as the benefit of weight loss to improve her health. She was advised of the need for long term treatment and the importance of lifestyle modifications to improve her current health and to decrease her risk of future health problems.  AGREE: Multiple dietary modification options and treatment options were discussed and  Kaitlin Robles agreed to follow the recommendations documented in the above note.  ARRANGE: Kaitlin Robles was educated on the importance of frequent visits to treat obesity  as outlined per CMS and USPSTF guidelines and agreed to schedule her next follow up appointment today.  Kaitlin Robles, am acting as transcriptionist for Helane Rima, DO  I have reviewed the above documentation for accuracy and completeness, and I agree with the above. Helane Rima, DO

## 2019-07-16 ENCOUNTER — Telehealth: Payer: Self-pay | Admitting: Family Medicine

## 2019-07-16 ENCOUNTER — Encounter (INDEPENDENT_AMBULATORY_CARE_PROVIDER_SITE_OTHER): Payer: Self-pay | Admitting: Family Medicine

## 2019-07-16 NOTE — Telephone Encounter (Signed)
Copied from [place] [id number]. Topic: General - Other >> Jul 16, 2019 11:48 AM Keene Breath wrote: Reason for CRM: Patient called to ask for an emergency dose of her medication, buPROPion (WELLBUTRIN XL) 300 MG 24 hr tablet.  She stated that she is driving back to town and is out of this medication.  Please advise and call patient back at [phone]

## 2019-07-16 NOTE — Telephone Encounter (Signed)
See request °

## 2019-07-18 ENCOUNTER — Ambulatory Visit: Payer: Commercial Managed Care - PPO | Admitting: Gastroenterology

## 2019-07-18 NOTE — Telephone Encounter (Signed)
Please advise 

## 2019-07-19 NOTE — Telephone Encounter (Signed)
Ok to send in 30 day supply.  Algis Greenhouse. Jerline Pain, MD 07/19/2019 7:57 AM

## 2019-07-19 NOTE — Telephone Encounter (Signed)
Patient stated she picked up a refill,no need to send resolved.

## 2019-07-23 ENCOUNTER — Ambulatory Visit (INDEPENDENT_AMBULATORY_CARE_PROVIDER_SITE_OTHER): Payer: Commercial Managed Care - PPO | Admitting: Psychology

## 2019-07-23 ENCOUNTER — Other Ambulatory Visit: Payer: Self-pay

## 2019-07-23 ENCOUNTER — Ambulatory Visit (INDEPENDENT_AMBULATORY_CARE_PROVIDER_SITE_OTHER): Payer: Commercial Managed Care - PPO | Admitting: Neurology

## 2019-07-23 DIAGNOSIS — F909 Attention-deficit hyperactivity disorder, unspecified type: Secondary | ICD-10-CM

## 2019-07-23 DIAGNOSIS — F3289 Other specified depressive episodes: Secondary | ICD-10-CM | POA: Diagnosis not present

## 2019-07-23 DIAGNOSIS — R351 Nocturia: Secondary | ICD-10-CM

## 2019-07-23 DIAGNOSIS — G4730 Sleep apnea, unspecified: Secondary | ICD-10-CM

## 2019-07-23 DIAGNOSIS — G4763 Sleep related bruxism: Secondary | ICD-10-CM

## 2019-07-23 DIAGNOSIS — G4710 Hypersomnia, unspecified: Secondary | ICD-10-CM | POA: Diagnosis not present

## 2019-07-23 DIAGNOSIS — R0683 Snoring: Secondary | ICD-10-CM

## 2019-07-23 DIAGNOSIS — G4720 Circadian rhythm sleep disorder, unspecified type: Secondary | ICD-10-CM

## 2019-07-23 DIAGNOSIS — R519 Headache, unspecified: Secondary | ICD-10-CM

## 2019-07-23 DIAGNOSIS — G4719 Other hypersomnia: Secondary | ICD-10-CM

## 2019-07-31 ENCOUNTER — Encounter (INDEPENDENT_AMBULATORY_CARE_PROVIDER_SITE_OTHER): Payer: Self-pay | Admitting: Physician Assistant

## 2019-07-31 ENCOUNTER — Other Ambulatory Visit: Payer: Self-pay

## 2019-07-31 ENCOUNTER — Ambulatory Visit (INDEPENDENT_AMBULATORY_CARE_PROVIDER_SITE_OTHER): Payer: Commercial Managed Care - PPO | Admitting: Physician Assistant

## 2019-07-31 VITALS — BP 123/76 | HR 102 | Temp 98.4°F | Ht 63.0 in | Wt 299.0 lb

## 2019-07-31 DIAGNOSIS — E8881 Metabolic syndrome: Secondary | ICD-10-CM

## 2019-07-31 DIAGNOSIS — Z9189 Other specified personal risk factors, not elsewhere classified: Secondary | ICD-10-CM

## 2019-07-31 DIAGNOSIS — E559 Vitamin D deficiency, unspecified: Secondary | ICD-10-CM | POA: Diagnosis not present

## 2019-07-31 DIAGNOSIS — Z6841 Body Mass Index (BMI) 40.0 and over, adult: Secondary | ICD-10-CM

## 2019-08-02 NOTE — Progress Notes (Signed)
Office: [phone]  /  Fax: [phone]   HPI:  Chief Complaint: OBESITY Kaitlin Robles is here to discuss her progress with her obesity treatment plan. She is on the Category 2 plan and states she is following her eating plan approximately 90 % of the time. She states she is walking 20 minutes 5 times per week.  [name] states that she struggles to get in all of her protein at dinner. She is traveling to see her family in [place] over Christmas.  Insulin Resistance [name] has a diagnosis of insulin resistance and she is only taking one metformin currently, due to diarrhea when she is taking two metformin. [name] has no nausea or vomiting.  At risk for diabetes [name] is at higher than average risk for developing diabetes due to her obesity and insulin resistance.   Vitamin D deficiency [name] has a diagnosis of vitamin D deficiency. She is on vitamin D. [name] has no nausea, vomiting or muscle weakness.   Today's visit was # 3  Starting weight: 306 lbs Starting date: 06/26/2019 Today's weight : 299 lbs Today's date: 07/31/2019 Total lbs lost to date: 7 Total lbs lost since last in-office visit: 2  ASSESSMENT AND PLAN:  Insulin resistance  Vitamin D deficiency  At risk for diabetes mellitus  Class 3 severe obesity with serious comorbidity and body mass index (BMI) of 50.0 to 59.9 in adult, unspecified obesity type ([place])  PLAN:  Insulin Resistance Jazlene will continue to work on weight loss, exercise, and decreasing simple carbohydrates to help decrease the risk of diabetes. Stormy will continue with metformin and follow up with [place] as directed to closely monitor her progress.  Diabetes risk counseling (~15 min) [name] is a 28 y.o. female and has risk factors for diabetes including obesity and insulin resistance. We discussed intensive lifestyle modifications today with an emphasis on weight loss as well as increasing exercise and decreasing simple carbohydrates in her diet.  Vitamin D  Deficiency Low vitamin D level contributes to fatigue and are associated with obesity, breast, and colon cancer. [name] agrees to continue to take prescription Vit D @50 ,000 IU every 4 days #8 with no refills and she will follow up for routine testing of vitamin D, at least 2-3 times per year to avoid over-replacement. Collier agrees to follow up as directed.  Obesity [name] is currently in the action stage of change. As such, her goal is to continue with weight loss efforts She has agreed to follow the Category 2 plan [name] has been instructed to work up to a goal of 150 minutes of combined cardio and strengthening exercise per week for weight loss and overall health benefits. We discussed the following Behavioral Modification Strategies today: increasing lean protein intake, work on meal planning and easy cooking plans and travel eating strategies   [name] has agreed to follow up with our clinic in 2 weeks. She was informed of the importance of frequent follow up visits to maximize her success with intensive lifestyle modifications for her multiple health conditions.  ALLERGIES: Allergies  Allergen Reactions  . Banana Nausea Only  . Ceclor [Cefaclor] Hives  . Venomil Wasp Venom [Wasp Venom] Anaphylaxis  . Nickel Dermatitis    MEDICATIONS: Current Outpatient Medications on File Prior to Visit  Medication Sig Dispense Refill  . atomoxetine (STRATTERA) 60 MG capsule     . buPROPion (WELLBUTRIN XL) 300 MG 24 hr tablet 300 mg.     . cetirizine (ZYRTEC) 10 MG tablet Take 10 mg by  mouth daily.    . diazepam (VALIUM) 5 MG tablet Take 2.5 mg by mouth every 6 (six) hours as needed for anxiety.    Marland Kitchen. EPINEPHrine 0.3 mg/0.3 mL IJ SOAJ injection Inject 0.3 mg into the muscle once.    . escitalopram (LEXAPRO) 20 MG tablet Take 20 mg by mouth daily.    Marland Kitchen. FISH OIL-KRILL OIL PO Take by mouth.    . metFORMIN (GLUCOPHAGE) 1000 MG tablet Take 1 tablet (1,000 mg total) by mouth 2 (two) times daily with a meal.  180 tablet 3  . Multiple Vitamins-Minerals (WOMENS MULTIVITAMIN PO) Take by mouth.    . triamcinolone ointment (KENALOG) 0.5 % Apply 1 application topically 2 (two) times daily. 30 g 0  . Vitamin D, Ergocalciferol, (DRISDOL) 1.25 MG (50000 UT) CAPS capsule Take 1 capsule every 4 days 8 capsule 0   No current facility-administered medications on file prior to visit.    PAST MEDICAL HISTORY: Past Medical History:  Diagnosis Date  . Abdominal pain   . ADHD   . Adrenal insufficiency (Addison's disease) ([facility])   . Anxiety disorder   . B12 deficiency   . Back pain   . Bilateral ovarian cysts   . Chest pain   . Chronic UTI   . Colon polyps   . Depression   . Double vision   . Enlarged lymph nodes   . Head ache   . Hyperlipidemia   . Jaw symptom   . Lichen sclerosus   . Lichen sclerosus of female genitalia   . Malabsorption   . Migraine headache   . Multiple food allergies   . Obesity   . Palpitations   . Panic attacks   . Urticaria   . Vitamin D deficiency     PAST SURGICAL HISTORY: Past Surgical History:  Procedure Laterality Date  . COLONOSCOPY    . KIDNEY STONE SURGERY  2013  . WISDOM TOOTH EXTRACTION  2010    SOCIAL HISTORY: Social History   Tobacco Use  . Smoking status: Never Smoker  . Smokeless tobacco: Never Used  Substance Use Topics  . Alcohol use: Never  . Drug use: Never    FAMILY HISTORY: Family History  Problem Relation Age of Onset  . Diabetes Mother   . Hyperlipidemia Mother   . Hypertension Mother   . Miscarriages / [place] Mother   . Depression Mother   . Anxiety disorder Mother   . Obesity Mother   . Colon polyps Brother   . Hyperlipidemia Daughter   . COPD Maternal Grandfather   . Hypertension Maternal Grandfather   . Heart disease Maternal Grandfather   . Hypertension Paternal Grandfather   . Heart attack Paternal Grandfather   . Esophageal cancer Neg Hx   . Stomach cancer Neg Hx   . Colon cancer Neg Hx   . Pancreatic  cancer Neg Hx     ROS: Review of Systems  Constitutional: Positive for weight loss.  Gastrointestinal: Positive for diarrhea. Negative for nausea and vomiting.    PHYSICAL EXAM: Blood pressure 123/76, pulse (!) 102, temperature 98.4 F (36.9 C), temperature source Oral, height 5\' 3"  (1.6 m), weight 299 lb (135.6 kg), SpO2 97 %. Body mass index is 52.97 kg/m. Physical Exam Vitals reviewed.  Constitutional:      General: She is not in acute distress.    Appearance: Normal appearance. She is well-developed. She is obese.  Cardiovascular:     Rate and Rhythm: Normal rate.  Pulmonary:  Effort: Pulmonary effort is normal.  Musculoskeletal:        General: Normal range of motion.  Skin:    General: Skin is warm and dry.  Neurological:     Mental Status: She is alert and oriented to person, place, and time.  Psychiatric:        Mood and Affect: Mood normal.        Behavior: Behavior normal.     RECENT LABS AND TESTS: BMET    Component Value Date/Time   NA 137 06/26/2019 0849   K 4.2 06/26/2019 0849   CL 101 06/26/2019 0849   CO2 23 06/26/2019 0849   GLUCOSE 99 06/26/2019 0849   GLUCOSE 103 (H) 05/28/2019 1306   BUN 9 06/26/2019 0849   CREATININE 0.74 06/26/2019 0849   CALCIUM 9.1 06/26/2019 0849   GFRNONAA 111 06/26/2019 0849   GFRAA 128 06/26/2019 0849   Lab Results  Component Value Date   HGBA1C 5.1 06/26/2019   Lab Results  Component Value Date   INSULIN 27.3 (H) 06/26/2019   CBC    Component Value Date/Time   WBC 7.5 06/26/2019 0849   WBC 10.0 05/28/2019 1306   RBC 4.83 06/26/2019 0849   RBC 4.72 05/28/2019 1306   HGB 13.1 06/26/2019 0849   HCT 39.6 06/26/2019 0849   PLT 319 06/26/2019 0849   MCV 82 06/26/2019 0849   MCH 27.1 06/26/2019 0849   MCH 27.8 05/28/2019 1306   MCHC 33.1 06/26/2019 0849   MCHC 32.3 05/28/2019 1306   RDW 14.0 06/26/2019 0849   LYMPHSABS 2.2 06/26/2019 0849   EOSABS 0.2 06/26/2019 0849   BASOSABS 0.1 06/26/2019 0849     Iron/TIBC/Ferritin/ %Sat    Component Value Date/Time   IRON 46 06/26/2019 0849   TIBC 358 06/26/2019 0849   FERRITIN 36 06/26/2019 0849   FERRITIN 26 04/14/2017 0000   IRONPCTSAT 13 (L) 06/26/2019 0849   Lipid Panel     Component Value Date/Time   CHOL 229 (H) 06/26/2019 0849   TRIG 324 (H) 06/26/2019 0849   HDL 40 06/26/2019 0849   CHOLHDL 4 11/10/2017 0917   VLDL 53.6 (H) 11/10/2017 0917   LDLCALC 131 (H) 06/26/2019 0849   LDLDIRECT 131.0 11/10/2017 0917   Hepatic Function Panel     Component Value Date/Time   PROT 7.1 06/26/2019 0849   ALBUMIN 4.0 06/26/2019 0849   AST 21 06/26/2019 0849   ALT 23 06/26/2019 0849   ALKPHOS 98 06/26/2019 0849   BILITOT 0.4 06/26/2019 0849      Component Value Date/Time   TSH 2.530 06/26/2019 0849   TSH 2.51 11/10/2017 0917     Ref. Range 06/26/2019 08:49  Vitamin D, 25-Hydroxy Latest Ref Range: 30.0 - 100.0 ng/mL 21.1 (L)    I, [place] Crane, am acting as transcriptionist for Alois Cliche, PA-C I, Alois Cliche, PA-C have reviewed above note and agree with its content

## 2019-08-06 ENCOUNTER — Ambulatory Visit: Payer: Commercial Managed Care - PPO | Attending: Internal Medicine

## 2019-08-06 ENCOUNTER — Other Ambulatory Visit: Payer: Self-pay

## 2019-08-06 DIAGNOSIS — Z20822 Contact with and (suspected) exposure to covid-19: Secondary | ICD-10-CM

## 2019-08-07 LAB — NOVEL CORONAVIRUS, NAA: SARS-CoV-2, NAA: NOT DETECTED

## 2019-08-08 ENCOUNTER — Telehealth: Payer: Self-pay | Admitting: Neurology

## 2019-08-08 NOTE — Telephone Encounter (Signed)
Pt would very much like to be contacted with her sleep study results once available

## 2019-08-08 NOTE — Telephone Encounter (Signed)
As of this moment Dr Rexene Alberts has not read her sleep study. As soon as we have those results we will contact. I will send Dr Rexene Alberts a notification letting her know the patient has called in asking

## 2019-08-13 ENCOUNTER — Encounter: Payer: Self-pay | Admitting: Neurology

## 2019-08-13 ENCOUNTER — Telehealth: Payer: Self-pay | Admitting: Neurology

## 2019-08-13 NOTE — Telephone Encounter (Signed)
-----   Message from Kaitlin Age, MD sent at 08/13/2019 12:20 PM EST ----- Patient referred by Dr. Juleen China from [facility], seen by me on 07/09/19, diagnostic PSG on 07/23/19.   Please call and notify the patient that the recent sleep study did not demonstrate any significant obstructive or central sleep disordered breathing with the exception of mild snoring and supine sleep apnea, but overall AHI was under 5/hour. CPAP therapy is not warranted. Weight loss and avoiding the supine sleep position are recommended. The decreased sleep efficiency and reduced percentage of REM sleep may have underestimated her sleep disordered breathing.  She can FU with her PCP and weight management doctors as scheduled.

## 2019-08-13 NOTE — Progress Notes (Signed)
Patient referred by Dr. Juleen China from [facility], seen by me on 07/09/19, diagnostic PSG on 07/23/19.   Please call and notify the patient that the recent sleep study did not demonstrate any significant obstructive or central sleep disordered breathing with the exception of mild snoring and supine sleep apnea, but overall AHI was under 5/hour. CPAP therapy is not warranted. Weight loss and avoiding the supine sleep position are recommended. The decreased sleep efficiency and reduced percentage of REM sleep may have underestimated her sleep disordered breathing.  She can FU with her PCP and weight management doctors as scheduled.

## 2019-08-13 NOTE — Telephone Encounter (Signed)
Called patient to discuss sleep study results. No answer at this time. LVM for the patient to call back.   

## 2019-08-13 NOTE — Procedures (Signed)
PATIENT'S NAME:  Kaitlin Robles, Kaitlin Robles DOB:      1991/04/20      MR#:    [id number]     DATE OF RECORDING: 07/23/2019 REFERRING M.D.:  Dr. Helane Rima  Study Performed:   Baseline Polysomnogram HISTORY: 28 year old woman with a history of Addison's disease, B12 deficiency, vitamin D deficiency, migraine headaches, hyperlipidemia, depression, anxiety, ADHD, PCOS, palpitations, and morbid obesity with a BMI of over 50, who reports snoring and excessive daytime somnolence. The patient endorsed the Epworth Sleepiness Scale at 11 points. The patient's weight 306 pounds with a height of 63 (inches), resulting in a BMI of 54.3 kg/m2. The patient's neck circumference measured 18.5 inches.  CURRENT MEDICATIONS: Stratera, Wellbutrin, Zyrtec, Valium, Lexapro, Metformin, Kenalog, Drisdol   PROCEDURE:  This is a multichannel digital polysomnogram utilizing the Somnostar 11.2 system.  Electrodes and sensors were applied and monitored per AASM Specifications.   EEG, EOG, Chin and Limb EMG, were sampled at 200 Hz.  ECG, Snore and Nasal Pressure, Thermal Airflow, Respiratory Effort, CPAP Flow and Pressure, Oximetry was sampled at 50 Hz. Digital video and audio were recorded.      BASELINE STUDY  Lights Out was at 21:55 and Lights On at 05:00.  Total recording time (TRT) was 425.5 minutes, with a total sleep time (TST) of 233.5 minutes.   The patient's sleep latency was 139 minutes, which is markedly delayed. REM latency was 248 minutes, which is markedly delayed. The sleep efficiency was 54.9%, which is significantly reduced.     SLEEP ARCHITECTURE: WASO (Wake after sleep onset) was 112 minutes with moderate to severe sleep fragmentation noted. There were 69 minutes in Stage N1, 93.5 minutes Stage N2, 58.5 minutes Stage N3 and 12.5 minutes in Stage REM.  The percentage of Stage N1 was 29.6%, which is markedly increased, Stage N2 was 40.%, Stage N3 was 25.1% and Stage R (REM sleep) was 5.4%, which is markedly reduced. The  arousals were noted as: 68 were spontaneous, 24 were associated with PLMs, 7 were associated with respiratory events.  RESPIRATORY ANALYSIS:  There were a total of 14 respiratory events:  0 obstructive apneas, 0 central apneas and 0 mixed apneas with a total of 0 apneas and an apnea index (AI) of 0 /hour. There were 14 hypopneas with a hypopnea index of 3.6 /hour. The patient also had 0 respiratory event related arousals (RERAs).      The total APNEA/HYPOPNEA INDEX (AHI) was 3.6/hour and the total RESPIRATORY DISTURBANCE INDEX was  3.6 /hour.  0 events occurred in REM sleep and 28 events in NREM. The REM AHI was  0 /hour, versus a non-REM AHI of 3.8. The patient spent 13.5 minutes of total sleep time in the supine position and 220 minutes in non-supine.. The supine AHI was 22.2 versus a non-supine AHI of 2.5.  OXYGEN SATURATION & C02:  The Wake baseline 02 saturation was 96%, with the lowest being 87%. Time spent below 89% saturation equaled 0 minutes.  PERIODIC LIMB MOVEMENTS: The patient had a total of 34 Periodic Limb Movements.  The Periodic Limb Movement (PLM) index was 8.7 and the PLM Arousal index was 6.2/hour.  Audio and video analysis did not show any abnormal or unusual movements, behaviors, phonations or vocalizations. The patient took no bathroom breaks. Mild snoring was noted. The EKG was in keeping with normal sinus rhythm (NSR).  Post-study, the patient indicated that sleep was worse than usual.   IMPRESSION:  1. Sleep disordered breathing 2. Dysfunctions associated  with sleep stages or arousal from sleep  RECOMMENDATIONS:  1. This study does not demonstrate any significant obstructive or central sleep disordered breathing with the exception of mild snoring and supine sleep apnea. CPAP therapy is not warranted. Weight loss and avoiding the supine sleep position are recommended. The decreased sleep efficiency and reduced percentage of REM sleep may underestimate her sleep  disordered breathing.  2. This study shows sleep fragmentation and abnormal sleep stage percentages; these are nonspecific findings and per se do not signify an intrinsic sleep disorder or a cause for the patient's sleep-related symptoms. Causes include (but are not limited to) the first night effect of the sleep study, circadian rhythm disturbances, medication effect or an underlying mood disorder or medical problem.  3. The patient should be cautioned not to drive, work at heights, or operate dangerous or heavy equipment when tired or sleepy. Review and reiteration of good sleep hygiene measures should be pursued with any patient. 4. The patient will be advised to follow up with the referring provider, who will be notified of the test results.  I certify that I have reviewed the entire raw data recording prior to the issuance of this report in accordance with the Standards of Accreditation of the American Academy of Sleep Medicine (AASM)   Star Age, MD, PhD Diplomat, American Board of Neurology and Sleep Medicine (Neurology and Sleep Medicine)

## 2019-08-20 NOTE — Progress Notes (Unsigned)
Office: [phone]  /  Fax: [phone]    Date: August 23, 2019   Appointment Start Time: *** Duration: *** minutes Provider: Lawerance Cruel, Psy.D. Type of Session: Individual Therapy  Location of Patient: {gbptloc:23249} Location of Provider: {Location of Service:22491} Type of Contact: Telepsychological Visit via {gbtelepsych:23399}  Session Content: Kaitlin Robles is a 29 y.o. female presenting via {gbtelepsych:23399} for a follow-up appointment to address the previously established treatment goal of decreasing emotional eating. Today's appointment was a telepsychological visit due to COVID-19. Kaitlin Robles provided verbal consent for today's telepsychological appointment and she is aware she is responsible for securing confidentiality on her end of the session. Prior to proceeding with today's appointment, Kaitlin Robles's physical location at the time of this appointment was obtained as well a phone number she could be reached at in the event of technical difficulties. Kaitlin Robles and this provider participated in today's telepsychological service.   This provider conducted a brief check-in and verbally administered the PHQ-9 and GAD-7. *** Kaitlin Robles was receptive to today's appointment as evidenced by openness to sharing, responsiveness to feedback, and {gbreceptiveness:23401}.  Mental Status Examination:  Appearance: {Appearance:22431} Behavior: {Behavior:22445} Mood: {gbmood:21757} Affect: {Affect:22436} Speech: {Speech:22432} Eye Contact: {Eye Contact:22433} Psychomotor Activity: {Motor Activity:22434} Gait: {gbgait:23404} Thought Process: {thought process:22448}  Thought Content/Perception: {disturbances:22451} Orientation: {Orientation:22437} Memory/Concentration: {gbcognition:22449} Insight/Judgment: {Insight:22446}  Structured Assessments Results: The Patient Health Questionnaire-9 (PHQ-9) is a self-report measure that assesses symptoms and severity of depression over the course of the last two  weeks. Kaitlin Robles obtained a score of *** suggesting {GBPHQ9SEVERITY:21752}. Kaitlin Robles finds the endorsed symptoms to be {gbphq9difficulty:21754}. [0= Not at all; 1= Several days; 2= More than half the days; 3= Nearly every day] Little interest or pleasure in doing things ***  Feeling down, depressed, or hopeless ***  Trouble falling or staying asleep, or sleeping too much ***  Feeling tired or having little energy ***  Poor appetite or overeating ***  Feeling bad about yourself --- or that you are a failure or have let yourself or your family down ***  Trouble concentrating on things, such as reading the newspaper or watching television ***  Moving or speaking so slowly that other people could have noticed? Or the opposite --- being so fidgety or restless that you have been moving around a lot more than usual ***  Thoughts that you would be better off dead or hurting yourself in some way ***  PHQ-9 Score ***    The Generalized Anxiety Disorder-7 (GAD-7) is a brief self-report measure that assesses symptoms of anxiety over the course of the last two weeks. Kaitlin Robles obtained a score of *** suggesting {gbgad7severity:21753}. Kaitlin Robles finds the endorsed symptoms to be {gbphq9difficulty:21754}. [0= Not at all; 1= Several days; 2= Over half the days; 3= Nearly every day] Feeling nervous, anxious, on edge ***  Not being able to stop or control worrying ***  Worrying too much about different things ***  Trouble relaxing ***  Being so restless that it's hard to sit still ***  Becoming easily annoyed or irritable ***  Feeling afraid as if something awful might happen ***  GAD-7 Score ***   Interventions:  {Interventions for Progress Notes:23405}  DSM-5 Diagnoses: 311 (F32.8) Other Specified Depressive Disorder, Emotional Eating Behaviors and 314.01 (F90.9) Unspecified Attention-Deficit/Hyperactivity Disorder   Treatment Goal & Progress: During the initial appointment with this provider, the following treatment  goal was established: decrease emotional eating. [name] has demonstrated progress in her goal as evidenced by {gbtxprogress:22839}. Kaitlin Robles also {gbtxprogress2:22951}.  Plan: The next appointment  will be scheduled in {gbweeks:21758}, which will be {gbtxmodality:23402}. The next session will focus on {Plan for Next Appointment:23400}.

## 2019-08-21 ENCOUNTER — Encounter (INDEPENDENT_AMBULATORY_CARE_PROVIDER_SITE_OTHER): Payer: Self-pay | Admitting: Family Medicine

## 2019-08-21 ENCOUNTER — Ambulatory Visit (INDEPENDENT_AMBULATORY_CARE_PROVIDER_SITE_OTHER): Payer: Commercial Managed Care - PPO | Admitting: Family Medicine

## 2019-08-21 ENCOUNTER — Other Ambulatory Visit: Payer: Self-pay

## 2019-08-21 VITALS — BP 136/81 | HR 102 | Temp 98.2°F | Ht 63.0 in | Wt 302.0 lb

## 2019-08-21 DIAGNOSIS — F909 Attention-deficit hyperactivity disorder, unspecified type: Secondary | ICD-10-CM

## 2019-08-21 DIAGNOSIS — E8881 Metabolic syndrome: Secondary | ICD-10-CM

## 2019-08-21 DIAGNOSIS — Z9189 Other specified personal risk factors, not elsewhere classified: Secondary | ICD-10-CM

## 2019-08-21 DIAGNOSIS — F3289 Other specified depressive episodes: Secondary | ICD-10-CM

## 2019-08-21 DIAGNOSIS — E559 Vitamin D deficiency, unspecified: Secondary | ICD-10-CM | POA: Diagnosis not present

## 2019-08-21 DIAGNOSIS — Z6841 Body Mass Index (BMI) 40.0 and over, adult: Secondary | ICD-10-CM

## 2019-08-22 MED ORDER — VITAMIN D (ERGOCALCIFEROL) 1.25 MG (50000 UNIT) PO CAPS: 50000.0000 [IU] | ORAL_CAPSULE | ORAL | 0 refills | Status: DC

## 2019-08-23 ENCOUNTER — Ambulatory Visit (INDEPENDENT_AMBULATORY_CARE_PROVIDER_SITE_OTHER): Payer: Commercial Managed Care - PPO | Admitting: Psychology

## 2019-08-23 NOTE — Progress Notes (Signed)
Chief Complaint: OBESITY Kaitlin Robles is here to discuss her progress with her obesity treatment plan along with follow-up of her obesity related diagnoses. Kaitlin Robles is on the Category 2 Plan and states she is following her eating plan approximately 60% of the time. Kaitlin Robles states she is swimming for 30 minutes 1 time per week, and walking 2-4 miles 7 times per week.  Today's visit was #: 4 Starting weight: 306 lbs Starting date: 06/26/2019 Today's weight: 302 lbs Today's date: 08/21/2019 Total lbs lost to date: 4 Total lbs lost since last in-office visit: 0  Interim History: Kaitlin Robles gained weight over the holidays. She is ready to get back to the plan. She wants to exercise. She used to go to the gym. We discussed pilates, [employer] training, and pelaton.  Subjective:   1. Insulin resistance Sher is on metformin and she was told to increase to BID, but she is hesitant due to previous diarrhea side effects.  2. Vitamin D deficiency Kaitlin Robles is taking prescription Vit D. Last Vit D level was 21.1 on 06/26/2019.  3. Attention deficit hyperactivity disorder (ADHD), unspecified ADHD type Kaitlin Robles is taking Strattera 60 mg.   4. Other depression,with emotional eating  Kaitlin Robles is taking Wellbutrin 300 mg daily.  5. At risk for diabetes mellitus [place] is at higher than average risk for developing diabetes due to her obesity.   Assessment/Plan:   1. Insulin resistance [name] will continue to work on weight loss, exercise, and decreasing simple carbohydrates to help decrease the risk of diabetes. [name] agrees to slowly increase her metformin, and we will continue to monitor her progress.  2. Vitamin D deficiency Low Vitamin D level contributes to fatigue and are associated with obesity, breast, and colon cancer. She agrees to continue to take prescription Vitamin D @50 ,000 IU every week and will follow-up for routine testing of vitamin D, at least 2-3 times per year to avoid  over-replacement.  Orders - Vitamin D, Ergocalciferol, (DRISDOL) 1.25 MG (50000 UT) CAPS capsule; Take 1 capsule (50,000 Units total) by mouth every 7 (seven) days. Take 1 capsule every 4 days  Dispense: 4 capsule; Refill: 0. We will continue to monitor.  3. Attention deficit hyperactivity disorder (ADHD), unspecified ADHD type [name] agrees to continue her medications, and we will continue to monitor her progress.  4. Other depression,with emotional eating  Current treatment plan is effective, no change in therapy.  Behavior modification techniques were discussed today to help @FNAME @ deal with @HIS @ emotional/non-hunger eating behaviors.  Orders and follow up as documented in patient record.   5. At risk for diabetes mellitus Kaitlin Robles is a 29 y.o. female and has risk factors for diabetes including obesity. We discussed intensive lifestyle modifications today with an emphasis on weight loss as well as increasing exercise and decreasing simple carbohydrates in her diet. (15 minutes)  6. Class 3 severe obesity with serious comorbidity and body mass index (BMI) of 50.0 to 59.9 in adult, unspecified obesity type [facility]) Kaitlin Robles is currently in the action stage of change. As such, her goal is to continue with weight loss efforts. She has agreed to Category 2 Plan.   We discussed the following exercise goals today: For substantial health benefits, adults should do at least 150 minutes (2 hours and 30 minutes) a week of moderate-intensity, or 75 minutes (1 hour and 15 minutes) a week of vigorous-intensity aerobic physical activity, or an equivalent combination of moderate- and vigorous-intensity aerobic activity. Aerobic activity should be performed in  episodes of at least 10 minutes, and preferably, it should be spread throughout the week. Adults should also include muscle-strengthening activities that involve all major muscle groups on 2 or more days a week.  We discussed the following behavioral modification  strategies today: increasing lean protein intake, decreasing simple carbohydrates, increasing vegetables and increasing water intake.  [place]aleese Kaitlin Robles has agreed to follow-up with our clinic in 2 weeks. She was informed of the importance of frequent follow-up visits to maximize her success with intensive lifestyle modifications for her multiple health conditions.  Objective:   Blood pressure 136/81, pulse (!) 102, temperature 98.2 F (36.8 C), temperature source Oral, height 5\' 3"  (1.6 m), weight (!) 302 lb (137 kg), SpO2 99 %. Body mass index is 53.5 kg/m.  General: Cooperative, alert, well developed, in no acute distress. HEENT: Conjunctivae and lids unremarkable. Neck: No thyromegaly.  Cardiovascular: Regular rhythm.  Lungs: Normal work of breathing. Extremities: No edema.  Neurologic: No focal deficits.   Lab Results  Component Value Date   CREATININE 0.74 06/26/2019   BUN 9 06/26/2019   NA 137 06/26/2019   K 4.2 06/26/2019   CL 101 06/26/2019   CO2 23 06/26/2019   Lab Results  Component Value Date   ALT 23 06/26/2019   AST 21 06/26/2019   ALKPHOS 98 06/26/2019   BILITOT 0.4 06/26/2019   Lab Results  Component Value Date   HGBA1C 5.1 06/26/2019   Lab Results  Component Value Date   INSULIN 27.3 (H) 06/26/2019   Lab Results  Component Value Date   TSH 2.530 06/26/2019   Lab Results  Component Value Date   CHOL 229 (H) 06/26/2019   HDL 40 06/26/2019   LDLCALC 131 (H) 06/26/2019   LDLDIRECT 131.0 11/10/2017   TRIG 324 (H) 06/26/2019   CHOLHDL 4 11/10/2017   Lab Results  Component Value Date   WBC 7.5 06/26/2019   HGB 13.1 06/26/2019   HCT 39.6 06/26/2019   MCV 82 06/26/2019   PLT 319 06/26/2019   Lab Results  Component Value Date   IRON 46 06/26/2019   TIBC 358 06/26/2019   FERRITIN 36 06/26/2019   Attestation Statements:   Reviewed by clinician on day of visit: allergies, medications, problem list, medical history, surgical history, family  history, social history and previous encounter notes.  This visit occurred during the SARS-CoV-2 public health emergency. Safety protocols were in place, including screening questions prior to the visit, additional usage of staff PPE, and extensive cleaning of exam room while observing appropriate contact time as indicated for disinfecting solutions. (CPT 13/05/2019)  I, [id number], am acting as transcriptionist for Burt Knack, DO.  I have reviewed the above documentation for accuracy and completeness, and I agree with the above. Helane Rima, DO

## 2019-08-24 ENCOUNTER — Encounter (INDEPENDENT_AMBULATORY_CARE_PROVIDER_SITE_OTHER): Payer: Self-pay | Admitting: Family Medicine

## 2019-09-05 ENCOUNTER — Encounter (INDEPENDENT_AMBULATORY_CARE_PROVIDER_SITE_OTHER): Payer: Self-pay | Admitting: Family Medicine

## 2019-09-05 ENCOUNTER — Ambulatory Visit (INDEPENDENT_AMBULATORY_CARE_PROVIDER_SITE_OTHER): Payer: Commercial Managed Care - PPO | Admitting: Family Medicine

## 2019-09-05 ENCOUNTER — Other Ambulatory Visit: Payer: Self-pay

## 2019-09-05 VITALS — BP 129/81 | HR 99 | Temp 97.9°F | Ht 63.0 in | Wt 301.0 lb

## 2019-09-05 DIAGNOSIS — F3289 Other specified depressive episodes: Secondary | ICD-10-CM | POA: Diagnosis not present

## 2019-09-05 DIAGNOSIS — E559 Vitamin D deficiency, unspecified: Secondary | ICD-10-CM | POA: Diagnosis not present

## 2019-09-05 DIAGNOSIS — Z9189 Other specified personal risk factors, not elsewhere classified: Secondary | ICD-10-CM

## 2019-09-05 DIAGNOSIS — E8881 Metabolic syndrome: Secondary | ICD-10-CM | POA: Diagnosis not present

## 2019-09-05 DIAGNOSIS — E7849 Other hyperlipidemia: Secondary | ICD-10-CM

## 2019-09-05 DIAGNOSIS — Z6841 Body Mass Index (BMI) 40.0 and over, adult: Secondary | ICD-10-CM

## 2019-09-05 MED ORDER — VITAMIN D (ERGOCALCIFEROL) 1.25 MG (50000 UNIT) PO CAPS: 50000.0000 [IU] | ORAL_CAPSULE | ORAL | 0 refills | Status: DC

## 2019-09-05 MED ORDER — METFORMIN HCL 1000 MG PO TABS: 1000.0000 mg | ORAL_TABLET | Freq: Two times a day (BID) | ORAL | 0 refills | Status: DC

## 2019-09-05 NOTE — Progress Notes (Signed)
Chief Complaint:   OBESITY Kaitlin Robles is here to discuss her progress with her obesity treatment plan along with follow-up of her obesity related diagnoses. Kaitlin Robles is on the Category 2 Plan and states she is following her eating plan approximately 95% of the time. Kaitlin Robles states she is walking for 45 minutes 3 times per week.  Today's visit was #: 5 Starting weight: 306 lbs Starting date: 06/26/2019 Today's weight: 301 lbs Today's date: 09/05/2019 Total lbs lost to date: 5 lbs Total lbs lost since last in-office visit: 1 lb  Interim History: This may be Kaitlin Robles's last visit.  She may be moving for work purposes.  Subjective:   1. Vitamin D deficiency Kaitlin Robles's Vitamin D level was 21.1 on 06/26/2019. She is currently taking vit D. She denies nausea, vomiting or muscle weakness.  2. Insulin resistance Kaitlin Robles has a diagnosis of insulin resistance based on her elevated fasting insulin level >5. She continues to work on diet and exercise to decrease her risk of diabetes.  Currently taking metformin 1000 mg twice daily.  Lab Results  Component Value Date   INSULIN 27.3 (H) 06/26/2019   Lab Results  Component Value Date   HGBA1C 5.1 06/26/2019   3. Other hyperlipidemia Kaitlin Robles has hyperlipidemia and has been trying to improve her cholesterol levels with intensive lifestyle modification including a low saturated fat diet, exercise and weight loss. She denies any chest pain, claudication or myalgias.  Lab Results  Component Value Date   ALT 23 06/26/2019   AST 21 06/26/2019   ALKPHOS 98 06/26/2019   BILITOT 0.4 06/26/2019   Lab Results  Component Value Date   CHOL 229 (H) 06/26/2019   HDL 40 06/26/2019   LDLCALC 131 (H) 06/26/2019   LDLDIRECT 131.0 11/10/2017   TRIG 324 (H) 06/26/2019   CHOLHDL 4 11/10/2017   4. Other depression,with emotional eating and ADD Kaitlin Robles is struggling with emotional eating and using food for comfort to the extent that it is negatively impacting her health.  She has been working on behavior modification techniques to help reduce her emotional eating and has been unsuccessful. She shows no sign of suicidal or homicidal ideations.  She is taking Wellbutrin and Strattera.  5. At risk for diabetes mellitus Kaitlin Robles is at higher than average risk for developing diabetes due to her obesity and known insulin resistance.   Assessment/Plan:   1. Vitamin D deficiency Low Vitamin D level contributes to fatigue and are associated with obesity, breast, and colon cancer. She agrees to continue to take prescription Vitamin D @50 ,000 IU every week and will follow-up for routine testing of Vitamin D, at least 2-3 times per year to avoid over-replacement.  - Vitamin D, Ergocalciferol, (DRISDOL) 1.25 MG (50000 UNIT) CAPS capsule; Take 1 capsule (50,000 Units total) by mouth every 7 (seven) days. Take 1 capsule every 4 days  Dispense: 4 capsule; Refill: 0  2. Insulin resistance Kaitlin Robles will continue to work on weight loss, exercise, and decreasing simple carbohydrates to help decrease the risk of diabetes. Kaitlin Robles agreed to follow-up with Kaitlin Robles as directed to closely monitor her progress.  - metFORMIN (GLUCOPHAGE) 1000 MG tablet; Take 1 tablet (1,000 mg total) by mouth 2 (two) times daily with a meal.  Dispense: 180 tablet; Refill: 0  3. Other hyperlipidemia Cardiovascular risk and specific lipid/LDL goals reviewed.  We discussed several lifestyle modifications today and Kaitlin Robles will continue to work on diet, exercise and weight loss efforts. Orders and follow up as documented  in patient record.   Counseling Intensive lifestyle modifications are the first line treatment for this issue. . Dietary changes: Increase soluble fiber. Decrease simple carbohydrates. . Exercise changes: Moderate to vigorous-intensity aerobic activity 150 minutes per week if tolerated. . Lipid-lowering medications: see documented in medical record.  4. Other depression,with emotional eating  Behavior  modification techniques were discussed today to help Kaitlin Robles deal with her emotional/non-hunger eating behaviors.  Orders and follow up as documented in patient record.   5. At risk for diabetes mellitus Good blood sugar control is important to decrease the likelihood of diabetic complications such as nephropathy, neuropathy, limb loss, blindness, coronary artery disease, and death. Intensive lifestyle modification including diet, exercise and weight loss are the first line of treatment for diabetes. Repetitive spaced learning was employed today to elicit superior memory formation and behavioral change.  6. Class 3 severe obesity with serious comorbidity and body mass index (BMI) of 50.0 to 59.9 in adult, unspecified obesity type Kaitlin Robles) Kaitlin Robles is currently in the action stage of change. As such, her goal is to continue with weight loss efforts. She has agreed to the Category 2 Plan.   Exercise goals: For substantial health benefits, adults should do at least 150 minutes (2 hours and 30 minutes) a week of moderate-intensity, or 75 minutes (1 hour and 15 minutes) a week of vigorous-intensity aerobic physical activity, or an equivalent combination of moderate- and vigorous-intensity aerobic activity. Aerobic activity should be performed in episodes of at least 10 minutes, and preferably, it should be spread throughout the week. Adults should also include muscle-strengthening activities that involve all major muscle groups on 2 or more days a week.  Behavioral modification strategies: increasing lean protein intake, decreasing simple carbohydrates, increasing vegetables and increasing water intake.  Kaitlin Robles has agreed to follow-up with our clinic in prn weeks. She was informed of the importance of frequent follow-up visits to maximize her success with intensive lifestyle modifications for her multiple health conditions.   Objective:   Blood pressure 129/81, pulse 99, temperature 97.9 F (36.6 C), temperature  source Oral, height 5\' 3"  (1.6 m), weight (!) 301 lb (136.5 kg), last menstrual period 08/23/2019, SpO2 95 %. Body mass index is 53.32 kg/m.  General: Cooperative, alert, well developed, in no acute distress. HEENT: Conjunctivae and lids unremarkable. Cardiovascular: Regular rhythm.  Lungs: Normal work of breathing. Neurologic: No focal deficits.   Lab Results  Component Value Date   CREATININE 0.74 06/26/2019   BUN 9 06/26/2019   NA 137 06/26/2019   K 4.2 06/26/2019   CL 101 06/26/2019   CO2 23 06/26/2019   Lab Results  Component Value Date   ALT 23 06/26/2019   AST 21 06/26/2019   ALKPHOS 98 06/26/2019   BILITOT 0.4 06/26/2019   Lab Results  Component Value Date   HGBA1C 5.1 06/26/2019   Lab Results  Component Value Date   INSULIN 27.3 (H) 06/26/2019   Lab Results  Component Value Date   TSH 2.530 06/26/2019   Lab Results  Component Value Date   CHOL 229 (H) 06/26/2019   HDL 40 06/26/2019   LDLCALC 131 (H) 06/26/2019   LDLDIRECT 131.0 11/10/2017   TRIG 324 (H) 06/26/2019   CHOLHDL 4 11/10/2017   Lab Results  Component Value Date   WBC 7.5 06/26/2019   HGB 13.1 06/26/2019   HCT 39.6 06/26/2019   MCV 82 06/26/2019   PLT 319 06/26/2019   Lab Results  Component Value Date   IRON  46 06/26/2019   TIBC 358 06/26/2019   FERRITIN 36 06/26/2019   Attestation Statements:   Reviewed by clinician on day of visit: allergies, medications, problem list, medical history, surgical history, family history, social history, and previous encounter notes.  I, Insurance claims handler, CMA, am acting as Energy manager for [employer], DO.  I have reviewed the above documentation for accuracy and completeness, and I agree with the above. Helane Rima, DO

## 2019-09-09 ENCOUNTER — Encounter (INDEPENDENT_AMBULATORY_CARE_PROVIDER_SITE_OTHER): Payer: Self-pay | Admitting: Family Medicine

## 2019-11-29 ENCOUNTER — Other Ambulatory Visit (INDEPENDENT_AMBULATORY_CARE_PROVIDER_SITE_OTHER): Payer: Self-pay | Admitting: Family Medicine

## 2019-12-17 ENCOUNTER — Other Ambulatory Visit (INDEPENDENT_AMBULATORY_CARE_PROVIDER_SITE_OTHER): Payer: Self-pay | Admitting: Family Medicine

## 2020-02-25 ENCOUNTER — Telehealth: Payer: Self-pay | Admitting: Family Medicine

## 2020-02-25 NOTE — Telephone Encounter (Signed)
Patient needs a appt was never seen by Dr.Parker

## 2020-02-25 NOTE — Telephone Encounter (Signed)
Patient calling in and wanted a referral sent to [facility] in [place]. [phone].

## 2020-02-27 ENCOUNTER — Encounter: Payer: Self-pay | Admitting: Family Medicine

## 2020-02-27 ENCOUNTER — Telehealth (INDEPENDENT_AMBULATORY_CARE_PROVIDER_SITE_OTHER): Payer: BC Managed Care – PPO | Admitting: Family Medicine

## 2020-02-27 DIAGNOSIS — K580 Irritable bowel syndrome with diarrhea: Secondary | ICD-10-CM

## 2020-02-27 DIAGNOSIS — K6389 Other specified diseases of intestine: Secondary | ICD-10-CM

## 2020-02-27 DIAGNOSIS — F418 Other specified anxiety disorders: Secondary | ICD-10-CM

## 2020-02-27 NOTE — Assessment & Plan Note (Signed)
Stable. Continue lexapro 20mg  daily and wellbutrin 300mg  daily.

## 2020-02-27 NOTE — Assessment & Plan Note (Signed)
Symptoms have worsened. Will place refer to GI. Recommend that she continue FODMAPs. Also recommended IBGARD.

## 2020-02-27 NOTE — Progress Notes (Signed)
   Kaitlin Robles is a 29 y.o. female who presents today for a virtual office visit.  Assessment/Plan:  Chronic Problems Addressed Today: Irritable bowel syndrome with diarrhea Symptoms have worsened. Will place refer to GI. Recommend that she continue FODMAPs. Also recommended IBGARD.   Depression with anxiety Stable. Continue lexapro 20mg  daily and wellbutrin 300mg  daily.     Subjective:  HPI:  Symptoms started several months ago. 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 She is having diarrhea several times per day. No blood in stool. No melena. She has tried FODMAPs and symptoms seem to be improving slightly. Some abdominal cramping as well.        Objective/Observations  Physical Exam: Gen: NAD, resting comfortably Pulm: Normal work of breathing Neuro: Grossly normal, moves all extremities Psych: Normal affect and thought content  Virtual Visit via Video   I connected with on 02/27/20 at 11:30 AM EDT by a video enabled telemedicine application and verified that I am speaking with the correct person using two identifiers. The limitations of evaluation and management by telemedicine and the availability of in person appointments were discussed. The patient expressed understanding and agreed to proceed.   Patient location: Home Provider location: [place] [facility] [place] Persons participating in the virtual visit: Myself and Patient     02/29/20. [employer], MD 02/27/2020 10:17 AM

## 2020-03-07 ENCOUNTER — Telehealth: Payer: Self-pay | Admitting: Family Medicine

## 2020-03-07 NOTE — Telephone Encounter (Signed)
Patient called and stated we have the wrong fax number for the facility that she needs to been seen at [facility] the correct fax number [phone]

## 2020-03-10 NOTE — Telephone Encounter (Signed)
Please see message. °

## 2020-03-11 NOTE — Telephone Encounter (Signed)
Called [facility] yesterday. Confirmed that we do have the correct fax number. Faxed referral over for the third time. Got a successful fax notification. Will call [facility] again today to confirm that they received the referral.

## 2020-04-24 DIAGNOSIS — F3340 Major depressive disorder, recurrent, in remission, unspecified: Secondary | ICD-10-CM | POA: Diagnosis not present

## 2020-04-24 DIAGNOSIS — F411 Generalized anxiety disorder: Secondary | ICD-10-CM | POA: Diagnosis not present

## 2020-04-24 DIAGNOSIS — F900 Attention-deficit hyperactivity disorder, predominantly inattentive type: Secondary | ICD-10-CM | POA: Diagnosis not present

## 2020-04-30 DIAGNOSIS — K6389 Other specified diseases of intestine: Secondary | ICD-10-CM | POA: Diagnosis not present

## 2020-05-13 DIAGNOSIS — K529 Noninfective gastroenteritis and colitis, unspecified: Secondary | ICD-10-CM | POA: Diagnosis not present

## 2020-05-13 DIAGNOSIS — K6389 Other specified diseases of intestine: Secondary | ICD-10-CM | POA: Diagnosis not present

## 2020-05-21 DIAGNOSIS — F411 Generalized anxiety disorder: Secondary | ICD-10-CM | POA: Diagnosis not present

## 2020-05-28 DIAGNOSIS — F411 Generalized anxiety disorder: Secondary | ICD-10-CM | POA: Diagnosis not present

## 2020-06-04 DIAGNOSIS — F411 Generalized anxiety disorder: Secondary | ICD-10-CM | POA: Diagnosis not present

## 2020-06-10 DIAGNOSIS — K6389 Other specified diseases of intestine: Secondary | ICD-10-CM | POA: Diagnosis not present

## 2020-06-10 DIAGNOSIS — K580 Irritable bowel syndrome with diarrhea: Secondary | ICD-10-CM | POA: Diagnosis not present

## 2020-06-11 DIAGNOSIS — F411 Generalized anxiety disorder: Secondary | ICD-10-CM | POA: Diagnosis not present

## 2020-06-18 DIAGNOSIS — F411 Generalized anxiety disorder: Secondary | ICD-10-CM | POA: Diagnosis not present

## 2020-06-24 ENCOUNTER — Ambulatory Visit: Payer: Commercial Managed Care - PPO | Admitting: Internal Medicine

## 2020-06-25 DIAGNOSIS — F411 Generalized anxiety disorder: Secondary | ICD-10-CM | POA: Diagnosis not present

## 2020-07-02 DIAGNOSIS — K6389 Other specified diseases of intestine: Secondary | ICD-10-CM | POA: Diagnosis not present

## 2020-07-02 DIAGNOSIS — K580 Irritable bowel syndrome with diarrhea: Secondary | ICD-10-CM | POA: Diagnosis not present

## 2020-07-02 DIAGNOSIS — E282 Polycystic ovarian syndrome: Secondary | ICD-10-CM | POA: Diagnosis not present

## 2020-07-02 DIAGNOSIS — E569 Vitamin deficiency, unspecified: Secondary | ICD-10-CM | POA: Diagnosis not present

## 2020-07-07 ENCOUNTER — Other Ambulatory Visit: Payer: Self-pay | Admitting: Internal Medicine

## 2020-07-07 DIAGNOSIS — E8881 Metabolic syndrome: Secondary | ICD-10-CM

## 2021-01-21 IMAGING — CT CT ABD-PELV W/ CM
2 of 4 series · 16 of 46 positions shown, 18 images · IV contrast (OMNIPAQUE 300)
Comparison: None.

CLINICAL DATA: Lower abdominal pain and nausea. Blood in stool.

EXAM:
CT ABDOMEN AND PELVIS WITH CONTRAST
TECHNIQUE: Multidetector CT imaging of the abdomen and pelvis was performed
using the standard protocol following bolus administration of
intravenous contrast.
CONTRAST:  100mL OMNIPAQUE IOHEXOL 300 MG/ML  SOLN

[Series 2: abd/pel w · axial · 0.79mm/px · z∈[-551,-111]mm · 13 of 97 slices shown, 15 images]
[im 5/97  soft-tissue]
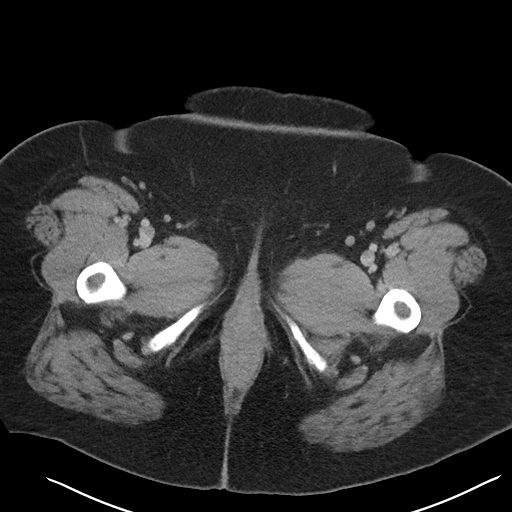
[im 5/97  bone]
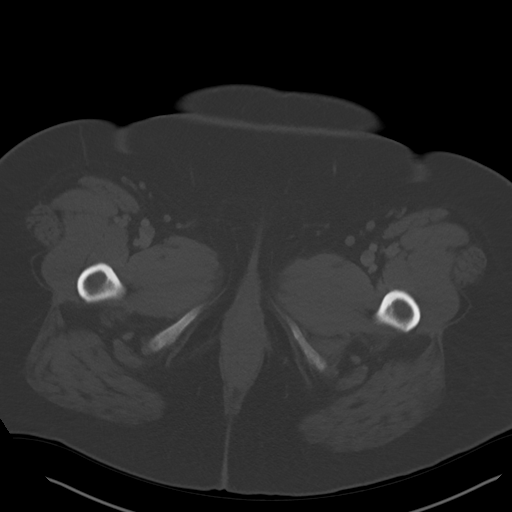
[im 13/97  soft-tissue]
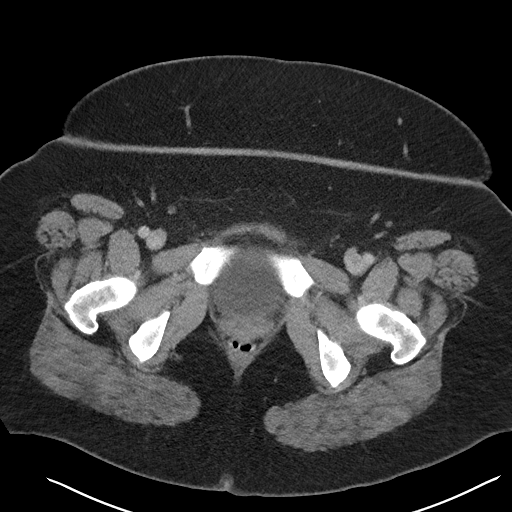
[im 21/97  soft-tissue]
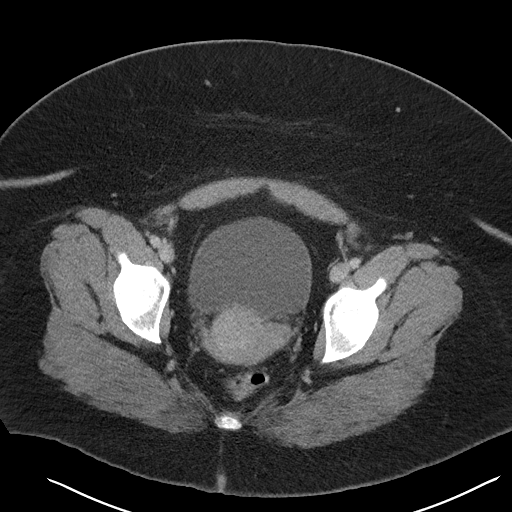
[im 29/97  soft-tissue]
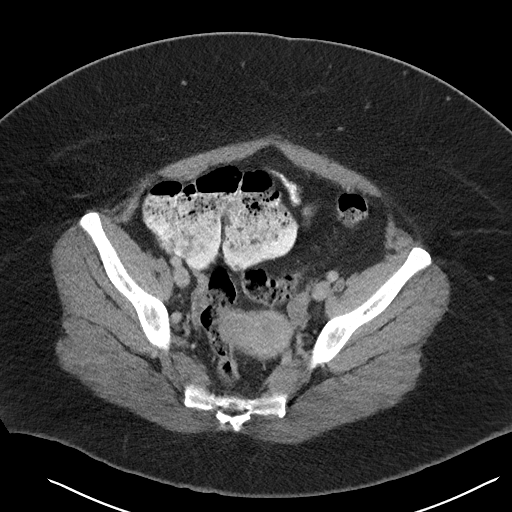
[im 33/97  soft-tissue]
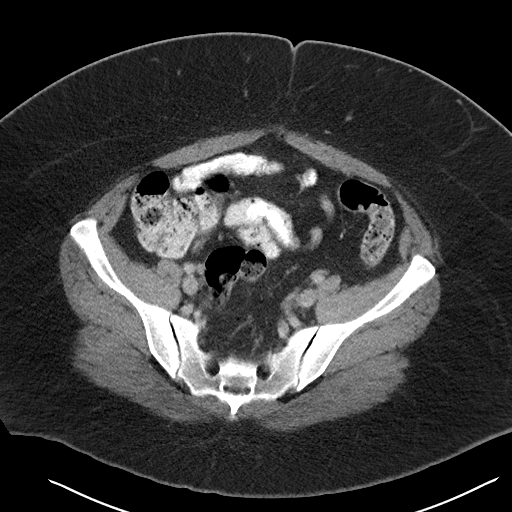
[im 41/97  soft-tissue]
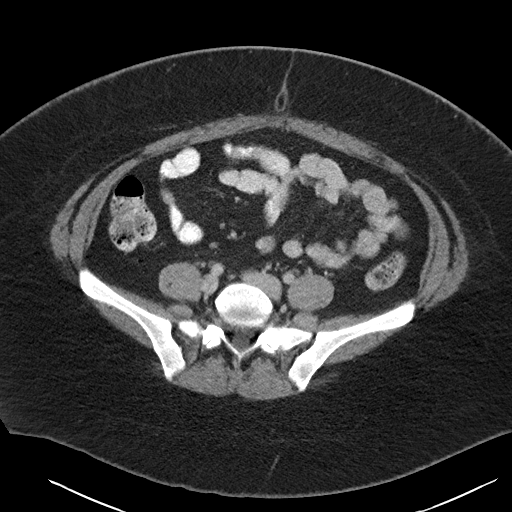
[im 49/97  soft-tissue]
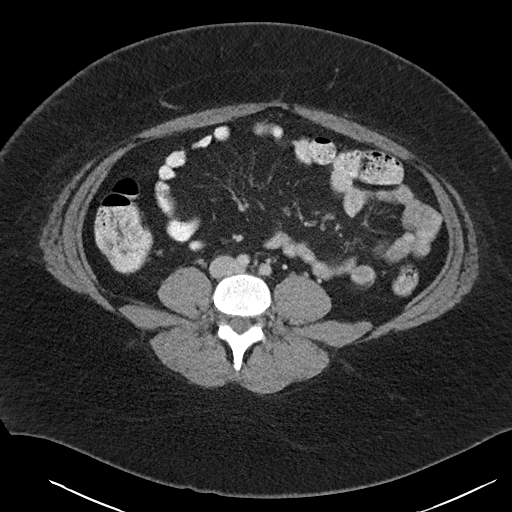
[im 57/97  soft-tissue]
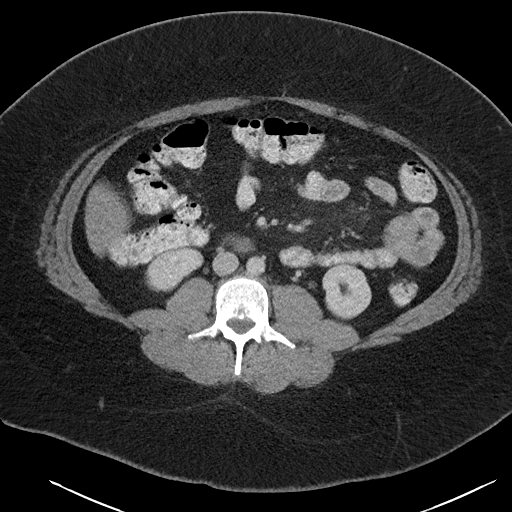
[im 65/97  soft-tissue]
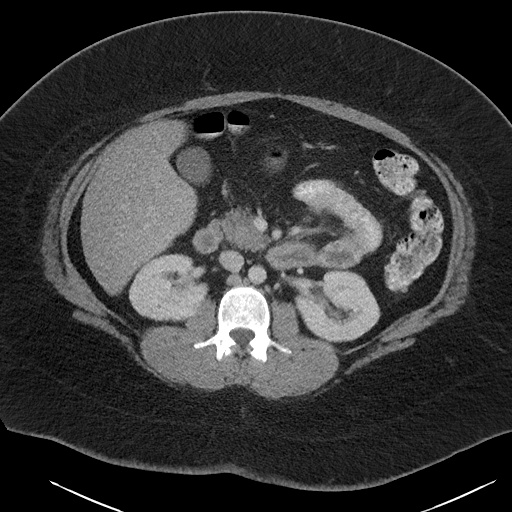
[im 65/97  bone]
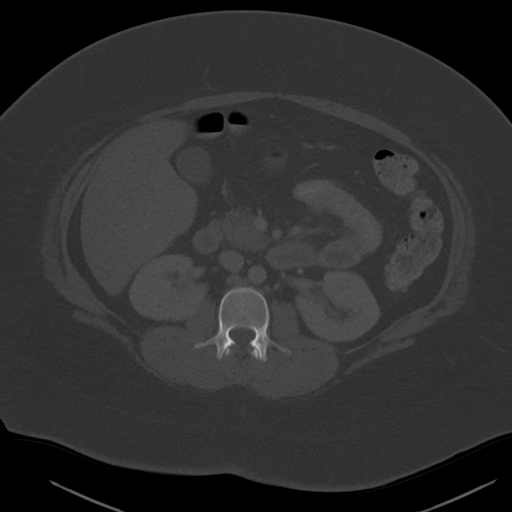
[im 69/97  soft-tissue]
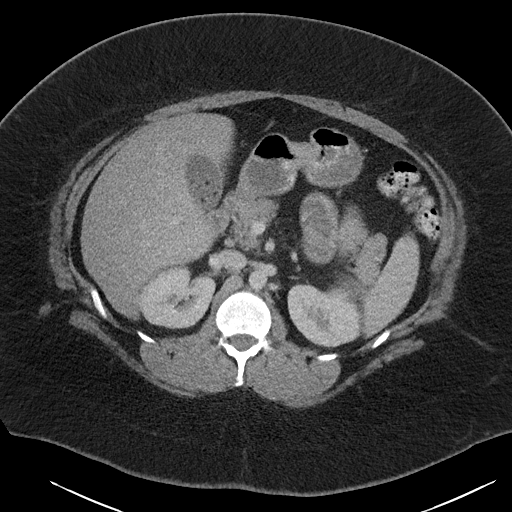
[im 77/97  soft-tissue]
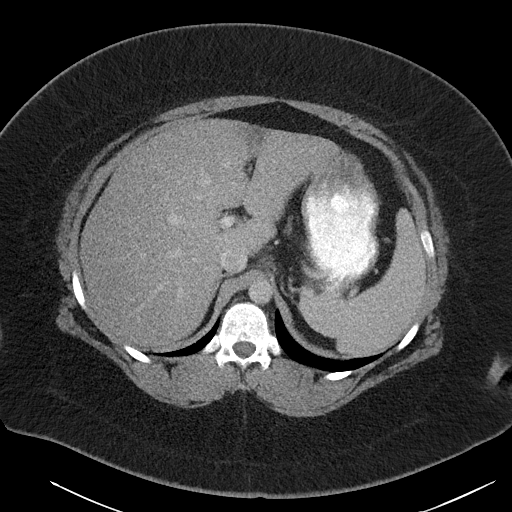
[im 85/97  soft-tissue]
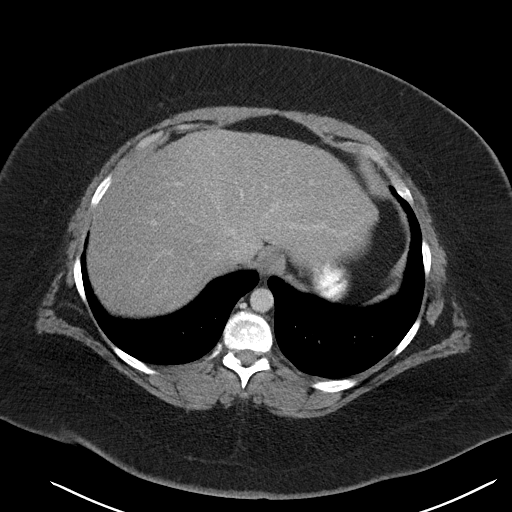
[im 93/97  soft-tissue]
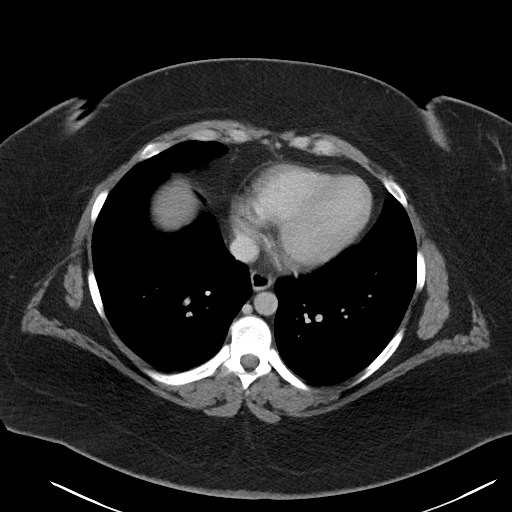

[Series 5: abd/pel w st · coronal · 0.70mm/px · 3 of 94 slices shown]
[im 32/94  soft-tissue]
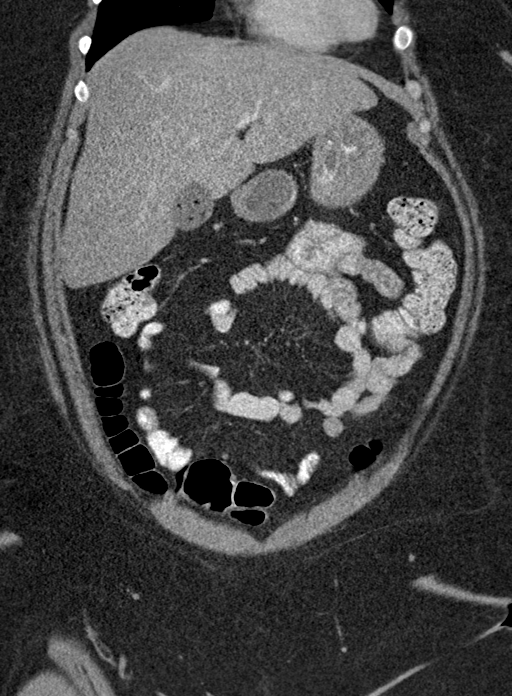
[im 42/94  soft-tissue]
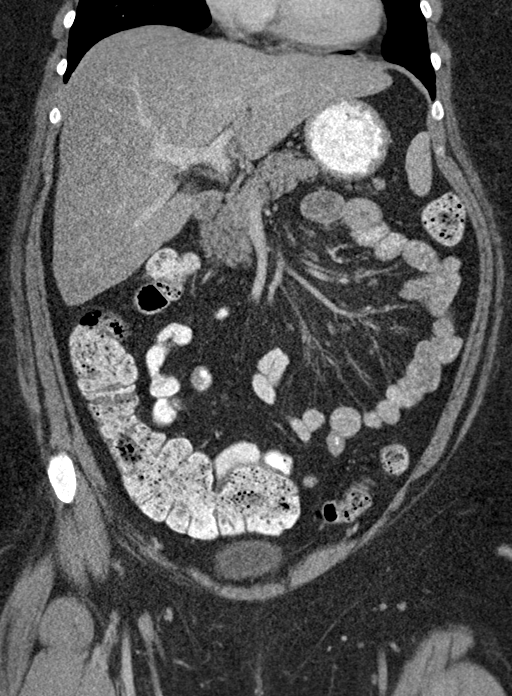
[im 52/94  soft-tissue]
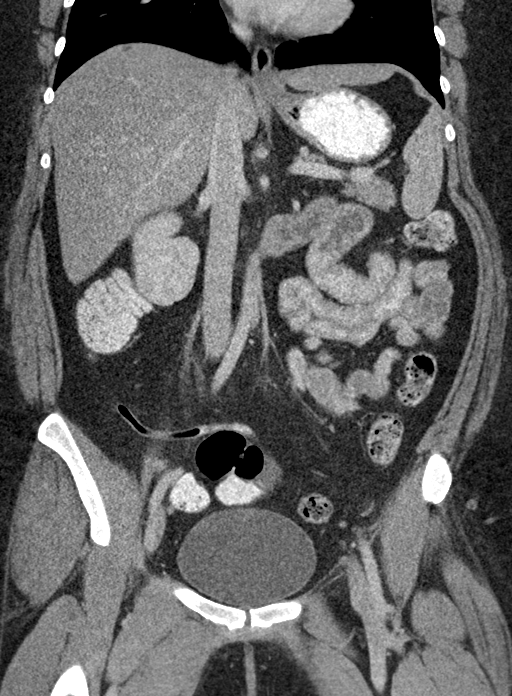

[16 of 46 positions shown; findings below may reference images not displayed]

FINDINGS: Lower Chest: No acute findings.

Hepatobiliary: No hepatic masses identified. Mild diffuse hepatic
steatosis, with focal fatty infiltration also seen adjacent to the
falciform ligament. Tiny cholesterol gallstones noted. No evidence
of cholecystitis or biliary ductal dilatation.

Pancreas:  No mass or inflammatory changes.

Spleen: Within normal limits in size and appearance.

Adrenals/Urinary Tract: No masses identified. No evidence of
hydronephrosis.

Stomach/Bowel: No evidence of obstruction, inflammatory process or
abnormal fluid collections. Normal appendix visualized.

Vascular/Lymphatic: No pathologically enlarged lymph nodes. No
abdominal aortic aneurysm.

Reproductive: Right anterior uterine fibroid seen measuring
approximately 5 cm. Adnexal regions are unremarkable.

Other:  None.

Musculoskeletal:  No suspicious bone lesions identified.
IMPRESSION: No acute findings within the abdomen or pelvis.

Hepatic steatosis.

Cholelithiasis. No radiographic evidence of cholecystitis.

5 cm uterine fibroid.

## 2021-04-13 ENCOUNTER — Other Ambulatory Visit (INDEPENDENT_AMBULATORY_CARE_PROVIDER_SITE_OTHER): Payer: Self-pay | Admitting: Family Medicine

## 2022-03-24 ENCOUNTER — Encounter (INDEPENDENT_AMBULATORY_CARE_PROVIDER_SITE_OTHER): Payer: Self-pay

## 2022-05-10 ENCOUNTER — Encounter: Payer: Self-pay | Admitting: *Deleted

## 2022-07-29 ENCOUNTER — Encounter: Payer: Self-pay | Admitting: *Deleted
